# Patient Record
Sex: Female | Born: 1990 | Race: Black or African American | Hispanic: No | Marital: Single | Smoking: Never smoker
Health system: Southern US, Community
[De-identification: ages and names within clinical notes are randomized; demographics above are authoritative.]

## PROBLEM LIST (undated history)

## (undated) ENCOUNTER — Inpatient Hospital Stay (HOSPITAL_COMMUNITY): Payer: Self-pay

## (undated) DIAGNOSIS — Z789 Other specified health status: Secondary | ICD-10-CM

## (undated) DIAGNOSIS — R87629 Unspecified abnormal cytological findings in specimens from vagina: Secondary | ICD-10-CM

## (undated) DIAGNOSIS — Z3490 Encounter for supervision of normal pregnancy, unspecified, unspecified trimester: Secondary | ICD-10-CM

## (undated) HISTORY — DX: Encounter for supervision of normal pregnancy, unspecified, unspecified trimester: Z34.90

## (undated) HISTORY — DX: Other specified health status: Z78.9

## (undated) HISTORY — PX: NO PAST SURGERIES: SHX2092

## (undated) HISTORY — DX: Unspecified abnormal cytological findings in specimens from vagina: R87.629

---
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
## 2011-05-22 ENCOUNTER — Encounter: Payer: Self-pay | Admitting: *Deleted

## 2011-05-22 ENCOUNTER — Emergency Department (HOSPITAL_COMMUNITY)
Admission: EM | Admit: 2011-05-22 | Discharge: 2011-05-22 | Disposition: A | Discharge status: Home / Self Care | Payer: PRIVATE HEALTH INSURANCE | Attending: Emergency Medicine | Admitting: Emergency Medicine

## 2011-05-22 ENCOUNTER — Emergency Department (HOSPITAL_COMMUNITY): Payer: PRIVATE HEALTH INSURANCE

## 2011-05-22 DIAGNOSIS — M25579 Pain in unspecified ankle and joints of unspecified foot: Secondary | ICD-10-CM | POA: Insufficient documentation

## 2011-05-22 DIAGNOSIS — M25571 Pain in right ankle and joints of right foot: Secondary | ICD-10-CM

## 2011-05-22 MED ORDER — IBUPROFEN 800 MG PO TABS
800.0000 mg | ORAL_TABLET | Freq: Once | ORAL | Status: AC
  Administered 2011-05-22: 800 mg via ORAL
  Filled 2011-05-22: qty 1

## 2011-05-22 NOTE — ED Provider Notes (Signed)
History     CSN: [id number] Arrival date & time: 05/22/2011  2:04 PM  Chief Complaint  Patient presents with  . Ankle Pain    right    HPI  (Consider location/radiation/quality/duration/timing/severity/associated sxs/prior treatment)  Patient is a 20 y.o. female presenting with ankle pain. The history is provided by the patient. No language interpreter was used.  Ankle Pain  Incident onset: pain started 4 days ago.  no pre-existing ankle problems. There was no injury mechanism. The pain is present in the right ankle.    History reviewed. No pertinent past medical history.  History reviewed. No pertinent past surgical history.  History reviewed. No pertinent family history.  History  Substance Use Topics  . Smoking status: Never Smoker   . Smokeless tobacco: Not on file  . Alcohol Use: No    OB History    Grav Para Term Preterm Abortions TAB SAB Ect Mult Living                  Review of Systems  Review of Systems  Musculoskeletal: Positive for arthralgias. Negative for joint swelling.  All other systems reviewed and are negative.    Allergies  Review of patient's allergies indicates no known allergies.  Home Medications  No current outpatient prescriptions on file.  Physical Exam    BP 117/75  Pulse 96  Temp(Src) 98.6 F (37 C) (Oral)  Resp 20  Ht 5\' 6"  (1.676 m)  Wt 130 lb (58.968 kg)  BMI 20.98 kg/m2  SpO2 100%  LMP 05/15/2011  Physical Exam  Nursing note and vitals reviewed. Constitutional: She is oriented to person, place, and time. She appears well-developed and well-nourished. No distress.  HENT:  Head: Normocephalic and atraumatic.  Eyes: EOM are normal.  Neck: Normal range of motion.  Cardiovascular: Normal rate, regular rhythm and normal heart sounds.   Pulmonary/Chest: Effort normal and breath sounds normal.  Abdominal: Soft. She exhibits no distension. There is no tenderness.  Musculoskeletal: She exhibits no edema and no  tenderness.       Right ankle: She exhibits decreased range of motion. She exhibits no swelling, no ecchymosis, no deformity, no laceration and normal pulse. no tenderness. Achilles tendon normal.       Feet:  Neurological: She is alert and oriented to person, place, and time.  Skin: Skin is warm and dry. She is not diaphoretic.  Psychiatric: She has a normal mood and affect. Judgment normal.    ED Course  Procedures (including critical care time)  Labs Reviewed - No data to display No results found.   No diagnosis found.   MDM         Worthy Rancher, PA 05/22/11 1620

## 2011-05-22 NOTE — ED Notes (Signed)
Pt denies injury to rt ankle, No swelling noted.  Pt states pain intensifies with weight bearing.

## 2011-05-22 NOTE — ED Notes (Signed)
Pt c/o pain to right ankle when she walks; pt states the ankle contiunously pops when she walks; pt denies any injury

## 2011-05-26 ENCOUNTER — Ambulatory Visit: Payer: PRIVATE HEALTH INSURANCE | Attending: Orthopaedic Surgery | Admitting: Physical Therapy

## 2011-05-26 DIAGNOSIS — M25579 Pain in unspecified ankle and joints of unspecified foot: Secondary | ICD-10-CM | POA: Insufficient documentation

## 2011-05-26 DIAGNOSIS — R262 Difficulty in walking, not elsewhere classified: Secondary | ICD-10-CM | POA: Insufficient documentation

## 2011-05-26 DIAGNOSIS — R5381 Other malaise: Secondary | ICD-10-CM | POA: Insufficient documentation

## 2011-05-26 DIAGNOSIS — IMO0001 Reserved for inherently not codable concepts without codable children: Secondary | ICD-10-CM | POA: Insufficient documentation

## 2011-05-26 DIAGNOSIS — M25676 Stiffness of unspecified foot, not elsewhere classified: Secondary | ICD-10-CM | POA: Insufficient documentation

## 2011-05-26 DIAGNOSIS — M25673 Stiffness of unspecified ankle, not elsewhere classified: Secondary | ICD-10-CM | POA: Insufficient documentation

## 2011-05-30 ENCOUNTER — Ambulatory Visit: Payer: PRIVATE HEALTH INSURANCE | Attending: Orthopaedic Surgery | Admitting: Physical Therapy

## 2011-05-30 DIAGNOSIS — R5381 Other malaise: Secondary | ICD-10-CM | POA: Insufficient documentation

## 2011-05-30 DIAGNOSIS — M25673 Stiffness of unspecified ankle, not elsewhere classified: Secondary | ICD-10-CM | POA: Insufficient documentation

## 2011-05-30 DIAGNOSIS — R262 Difficulty in walking, not elsewhere classified: Secondary | ICD-10-CM | POA: Insufficient documentation

## 2011-05-30 DIAGNOSIS — IMO0001 Reserved for inherently not codable concepts without codable children: Secondary | ICD-10-CM | POA: Insufficient documentation

## 2011-05-30 DIAGNOSIS — M25676 Stiffness of unspecified foot, not elsewhere classified: Secondary | ICD-10-CM | POA: Insufficient documentation

## 2011-05-30 DIAGNOSIS — M25579 Pain in unspecified ankle and joints of unspecified foot: Secondary | ICD-10-CM | POA: Insufficient documentation

## 2011-05-31 NOTE — ED Provider Notes (Signed)
Medical screening examination/treatment/procedure(s) were performed by non-physician practitioner and as supervising physician I was immediately available for consultation/collaboration.   Shelda Jakes, MD 05/31/11 [phone]

## 2011-06-01 ENCOUNTER — Encounter: Payer: PRIVATE HEALTH INSURANCE | Admitting: Physical Therapy

## 2013-03-15 ENCOUNTER — Telehealth: Payer: Self-pay | Admitting: Nurse Practitioner

## 2013-03-15 NOTE — Telephone Encounter (Signed)
appt made for sat.

## 2013-03-16 ENCOUNTER — Ambulatory Visit: Payer: Self-pay

## 2013-03-16 ENCOUNTER — Emergency Department (HOSPITAL_COMMUNITY)
Admission: EM | Admit: 2013-03-16 | Discharge: 2013-03-16 | Disposition: A | Discharge status: Home / Self Care | Payer: PRIVATE HEALTH INSURANCE | Attending: Emergency Medicine | Admitting: Emergency Medicine

## 2013-03-16 ENCOUNTER — Encounter (HOSPITAL_COMMUNITY): Payer: Self-pay | Admitting: Emergency Medicine

## 2013-03-16 DIAGNOSIS — L240 Irritant contact dermatitis due to detergents: Secondary | ICD-10-CM | POA: Insufficient documentation

## 2013-03-16 DIAGNOSIS — L239 Allergic contact dermatitis, unspecified cause: Secondary | ICD-10-CM

## 2013-03-16 DIAGNOSIS — L299 Pruritus, unspecified: Secondary | ICD-10-CM | POA: Insufficient documentation

## 2013-03-16 MED ORDER — CETIRIZINE HCL 10 MG PO TABS: 10.0000 mg | ORAL_TABLET | Freq: Every day | ORAL | Status: DC

## 2013-03-16 MED ORDER — FAMOTIDINE 20 MG PO TABS
20.0000 mg | ORAL_TABLET | Freq: Once | ORAL | Status: AC
  Administered 2013-03-16: 20 mg via ORAL
  Filled 2013-03-16: qty 1

## 2013-03-16 MED ORDER — PREDNISONE 10 MG PO TABS: 20.0000 mg | ORAL_TABLET | Freq: Two times a day (BID) | ORAL | Status: DC

## 2013-03-16 MED ORDER — FAMOTIDINE 20 MG PO TABS: 20.0000 mg | ORAL_TABLET | Freq: Two times a day (BID) | ORAL | Status: DC

## 2013-03-16 MED ORDER — PREDNISONE 20 MG PO TABS
40.0000 mg | ORAL_TABLET | Freq: Once | ORAL | Status: AC
  Administered 2013-03-16: 40 mg via ORAL
  Filled 2013-03-16: qty 2

## 2013-03-16 NOTE — ED Provider Notes (Signed)
History    CSN: [id number] Arrival date & time 03/16/13  2102  First MD Initiated Contact with Patient 03/16/13 2118     Chief Complaint  Patient presents with  . Rash   (Consider location/radiation/quality/duration/timing/severity/associated sxs/prior Treatment) Patient is a 22 y.o. female presenting with rash. The history is provided by the patient.  Rash Pain location:  Generalized Associated symptoms: no chills, no fever, no nausea, no shortness of breath and no vomiting    Sandy Peters is a 22 y.o. female who presents to the ED with a rash. The rash started a few days ago. She was visiting her family in [place] and washed her clothes in a different detergent. Since wearing the clothes has had the rash and itching. Denies fever, chills, nausea or vomiting or other problems.   History reviewed. No pertinent past medical history. History reviewed. No pertinent past surgical history. Family History  Problem Relation Age of Onset  . Hypertension Mother    History  Substance Use Topics  . Smoking status: Never Smoker   . Smokeless tobacco: Not on file  . Alcohol Use: No   OB History   Grav Para Term Preterm Abortions TAB SAB Ect Mult Living                 Review of Systems  Constitutional: Negative for fever and chills.  HENT: Negative for congestion.   Eyes: Negative for itching.  Respiratory: Negative for chest tightness and shortness of breath.   Gastrointestinal: Negative for nausea and vomiting.  Skin: Positive for rash.  Psychiatric/Behavioral: The patient is not nervous/anxious.     Allergies  Review of patient's allergies indicates no known allergies.  Home Medications   Current Outpatient Rx  Name  Route  Sig  Dispense  Refill  . cetirizine (ZYRTEC) 10 MG tablet   Oral   Take 1 tablet (10 mg total) by mouth daily.   20 tablet   0   . famotidine (PEPCID) 20 MG tablet   Oral   Take 1 tablet (20 mg total) by mouth 2 (two) times daily.   30 tablet  0   . predniSONE (DELTASONE) 10 MG tablet   Oral   Take 2 tablets (20 mg total) by mouth 2 (two) times daily.   20 tablet   0    BP 121/70  Pulse 85  Temp(Src) 98.1 F (36.7 C) (Oral)  Resp 14  Ht 5\' 6"  (1.676 m)  Wt 130 lb (58.968 kg)  BMI 20.99 kg/m2  SpO2 100%  LMP 03/14/2013 Physical Exam  Nursing note and vitals reviewed. Constitutional: She is oriented to person, place, and time. She appears well-developed and well-nourished. No distress.  HENT:  Head: Normocephalic.  Mouth/Throat: Uvula is midline, oropharynx is clear and moist and mucous membranes are normal.  Eyes: EOM are normal.  Neck: Neck supple.  Cardiovascular: Normal rate.   Pulmonary/Chest: Effort normal and breath sounds normal.  Musculoskeletal: Normal range of motion.  Neurological: She is alert and oriented to person, place, and time. No cranial nerve deficit.  Skin: Rash noted.  Rash to arms, legs,back and abdomen. Small red, raised, hive like areas.  Psychiatric: She has a normal mood and affect. Her behavior is normal.    ED Course  Procedures 1. Allergic dermatitis     MDM  22 y.o. female with rash after changing laundry detergent. Will treat as allergic dermitis.  Discussed with the patient clinical findings and plan of care and all  questioned fully answered. She will follow up with dermatology if symptoms persist.   Medication List         cetirizine 10 MG tablet  Commonly known as:  ZYRTEC  Take 1 tablet (10 mg total) by mouth daily.     famotidine 20 MG tablet  Commonly known as:  PEPCID  Take 1 tablet (20 mg total) by mouth 2 (two) times daily.     predniSONE 10 MG tablet  Commonly known as:  DELTASONE  Take 2 tablets (20 mg total) by mouth 2 (two) times daily.          Janne Napoleon, [place] 03/17/13 [phone]

## 2013-03-16 NOTE — ED Notes (Signed)
Pt c/o rash that started on Wed. Small red raised areas generalized over body. Pt was visiting a relative in [place] and said her aunt used a different detergent to wash her clothes.

## 2013-03-21 NOTE — ED Provider Notes (Signed)
Medical screening examination/treatment/procedure(s) were performed by non-physician practitioner and as supervising physician I was immediately available for consultation/collaboration.  Lakayla Barrington, MD 03/21/13 2235 

## 2013-06-13 ENCOUNTER — Encounter (INDEPENDENT_AMBULATORY_CARE_PROVIDER_SITE_OTHER): Payer: Self-pay

## 2013-06-13 ENCOUNTER — Ambulatory Visit (INDEPENDENT_AMBULATORY_CARE_PROVIDER_SITE_OTHER): Payer: 59 | Admitting: Family Medicine

## 2013-06-13 ENCOUNTER — Encounter: Payer: Self-pay | Admitting: Family Medicine

## 2013-06-13 VITALS — BP 111/70 | HR 80 | Temp 97.8°F | Ht 67.5 in | Wt 140.0 lb

## 2013-06-13 DIAGNOSIS — Z309 Encounter for contraceptive management, unspecified: Secondary | ICD-10-CM

## 2013-06-13 LAB — POCT URINE PREGNANCY: Preg Test, Ur: NEGATIVE

## 2013-06-13 MED ORDER — MEDROXYPROGESTERONE ACETATE 150 MG/ML IM SUSP: 150.0000 mg | Freq: Once | INTRAMUSCULAR | Status: DC

## 2013-06-13 NOTE — Progress Notes (Signed)
  Subjective:    Patient ID: Sandy Peters, female    DOB: 1991/06/24, 22 y.o.   MRN: [id number]  HPI Patient presents today to discuss contraceptive management. Patient's is present with both her boyfriend her mother. Per patient, she has been sexually active for the past 6-7 months. Has been using condoms for protection. Has had a fairly open discussion with both her mother and her boyfriend about birth control. Patient is considering Depo-Provera for use. Last measure cycle was October 5. No history of STDs in the past.   Review of Systems  All other systems reviewed and are negative.       Objective:   Physical Exam  Constitutional: She appears well-developed and well-nourished.  HENT:  Head: Normocephalic and atraumatic.  Eyes: Conjunctivae are normal. Pupils are equal, round, and reactive to light.  Neck: Normal range of motion.  Cardiovascular: Normal rate and regular rhythm.   Pulmonary/Chest: Effort normal and breath sounds normal.  Abdominal: Soft.  Musculoskeletal: Normal range of motion.  Neurological: She is alert.  Skin: Skin is warm.          Assessment & Plan:  Contraception management - Plan: POCT urine pregnancy, medroxyPROGESTERone (DEPO-PROVERA) 150 MG/ML injection  U preg negative. Will prescribe Depo-Provera for contraception use of the next 3 months. Broached the issue of using the Mirena IUD for long-term contraception. Patient states she will discuss this with her boyfriend and make a decision on followup in 3 months. Contraception handout given. Followup as needed.

## 2013-06-13 NOTE — Patient Instructions (Signed)
Contraception Choices  Contraception (birth control) is the use of any methods or devices to prevent pregnancy. Below are some methods to help avoid pregnancy.  HORMONAL METHODS   · Contraceptive implant. This is a thin, plastic tube containing progesterone hormone. It does not contain estrogen hormone. Your caregiver inserts the tube in the inner part of the upper arm. The tube can remain in place for up to 3 years. After 3 years, the implant must be removed. The implant prevents the ovaries from releasing an egg (ovulation), thickens the cervical mucus which prevents sperm from entering the uterus, and thins the lining of the inside of the uterus.  · Progesterone-only injections. These injections are given every 3 months by your caregiver to prevent pregnancy. This synthetic progesterone hormone stops the ovaries from releasing eggs. It also thickens cervical mucus and changes the uterine lining. This makes it harder for sperm to survive in the uterus.  · Birth control pills. These pills contain estrogen and progesterone hormone. They work by stopping the egg from forming in the ovary (ovulation). Birth control pills are prescribed by a caregiver. Birth control pills can also be used to treat heavy periods.  · Minipill. This type of birth control pill contains only the progesterone hormone. They are taken every day of each month and must be prescribed by your caregiver.  · Birth control patch. The patch contains hormones similar to those in birth control pills. It must be changed once a week and is prescribed by a caregiver.  · Vaginal ring. The ring contains hormones similar to those in birth control pills. It is left in the vagina for 3 weeks, removed for 1 week, and then a new one is put back in place. The patient must be comfortable inserting and removing the ring from the vagina. A caregiver's prescription is necessary.  · Emergency contraception. Emergency contraceptives prevent pregnancy after unprotected  sexual intercourse. This pill can be taken right after sex or up to 5 days after unprotected sex. It is most effective the sooner you take the pills after having sexual intercourse. Emergency contraceptive pills are available without a prescription. Check with your pharmacist. Do not use emergency contraception as your only form of birth control.  BARRIER METHODS   · Female condom. This is a thin sheath (latex or rubber) that is worn over the penis during sexual intercourse. It can be used with spermicide to increase effectiveness.  · Female condom. This is a soft, loose-fitting sheath that is put into the vagina before sexual intercourse.  · Diaphragm. This is a soft, latex, dome-shaped barrier that must be fitted by a caregiver. It is inserted into the vagina, along with a spermicidal jelly. It is inserted before intercourse. The diaphragm should be left in the vagina for 6 to 8 hours after intercourse.  · Cervical cap. This is a round, soft, latex or plastic cup that fits over the cervix and must be fitted by a caregiver. The cap can be left in place for up to 48 hours after intercourse.  · Sponge. This is a soft, circular piece of polyurethane foam. The sponge has spermicide in it. It is inserted into the vagina after wetting it and before sexual intercourse.  · Spermicides. These are chemicals that kill or block sperm from entering the cervix and uterus. They come in the form of creams, jellies, suppositories, foam, or tablets. They do not require a prescription. They are inserted into the vagina with an applicator before having sexual intercourse.   The process must be repeated every time you have sexual intercourse.  INTRAUTERINE CONTRACEPTION  · Intrauterine device (IUD). This is a T-shaped device that is put in a woman's uterus during a menstrual period to prevent pregnancy. There are 2 types:  · Copper IUD. This type of IUD is wrapped in copper wire and is placed inside the uterus. Copper makes the uterus and  fallopian tubes produce a fluid that kills sperm. It can stay in place for 10 years.  · Hormone IUD. This type of IUD contains the hormone progestin (synthetic progesterone). The hormone thickens the cervical mucus and prevents sperm from entering the uterus, and it also thins the uterine lining to prevent implantation of a fertilized egg. The hormone can weaken or kill the sperm that get into the uterus. It can stay in place for 5 years.  PERMANENT METHODS OF CONTRACEPTION  · Female tubal ligation. This is when the woman's fallopian tubes are surgically sealed, tied, or blocked to prevent the egg from traveling to the uterus.  · Female sterilization. This is when the female has the tubes that carry sperm tied off (vasectomy). This blocks sperm from entering the vagina during sexual intercourse. After the procedure, the man can still ejaculate fluid (semen).  NATURAL PLANNING METHODS  · Natural family planning. This is not having sexual intercourse or using a barrier method (condom, diaphragm, cervical cap) on days the woman could become pregnant.  · Calendar method. This is keeping track of the length of each menstrual cycle and identifying when you are fertile.  · Ovulation method. This is avoiding sexual intercourse during ovulation.  · Symptothermal method. This is avoiding sexual intercourse during ovulation, using a thermometer and ovulation symptoms.  · Post-ovulation method. This is timing sexual intercourse after you have ovulated.  Regardless of which type or method of contraception you choose, it is important that you use condoms to protect against the transmission of sexually transmitted diseases (STDs). Talk with your caregiver about which form of contraception is most appropriate for you.  Document Released: 08/15/2005 Document Revised: 11/07/2011 Document Reviewed: 12/22/2010  [employer]® Patient Information ©2014 [employer].

## 2013-06-14 ENCOUNTER — Ambulatory Visit: Payer: Self-pay | Admitting: General Practice

## 2013-06-18 ENCOUNTER — Ambulatory Visit (INDEPENDENT_AMBULATORY_CARE_PROVIDER_SITE_OTHER): Payer: 59 | Admitting: *Deleted

## 2013-06-18 DIAGNOSIS — IMO0001 Reserved for inherently not codable concepts without codable children: Secondary | ICD-10-CM

## 2013-06-18 DIAGNOSIS — Z309 Encounter for contraceptive management, unspecified: Secondary | ICD-10-CM

## 2013-06-18 MED ORDER — MEDROXYPROGESTERONE ACETATE 150 MG/ML IM SUSP
150.0000 mg | Freq: Once | INTRAMUSCULAR | Status: AC
  Administered 2013-06-18: 150 mg via INTRAMUSCULAR

## 2013-09-13 ENCOUNTER — Other Ambulatory Visit: Payer: Self-pay | Admitting: *Deleted

## 2013-09-13 ENCOUNTER — Ambulatory Visit (INDEPENDENT_AMBULATORY_CARE_PROVIDER_SITE_OTHER): Payer: 59 | Admitting: *Deleted

## 2013-09-13 DIAGNOSIS — Z309 Encounter for contraceptive management, unspecified: Secondary | ICD-10-CM

## 2013-09-13 DIAGNOSIS — IMO0001 Reserved for inherently not codable concepts without codable children: Secondary | ICD-10-CM

## 2013-09-13 MED ORDER — MEDROXYPROGESTERONE ACETATE 150 MG/ML IM SUSP
150.0000 mg | Freq: Once | INTRAMUSCULAR | Status: AC
  Administered 2013-09-13: 150 mg via INTRAMUSCULAR

## 2013-09-13 NOTE — Progress Notes (Signed)
Depo provera given and tolerated well. 

## 2013-09-13 NOTE — Patient Instructions (Signed)

## 2013-09-17 MED ORDER — MEDROXYPROGESTERONE ACETATE 150 MG/ML IM SUSP: 150.0000 mg | Freq: Once | INTRAMUSCULAR | Status: DC

## 2013-12-02 ENCOUNTER — Telehealth: Payer: Self-pay | Admitting: Family Medicine

## 2013-12-02 NOTE — Telephone Encounter (Signed)
Left patient a message April the 17th being the last day she could get it

## 2013-12-13 ENCOUNTER — Ambulatory Visit: Payer: 59

## 2013-12-27 ENCOUNTER — Other Ambulatory Visit: Payer: 59

## 2014-03-14 ENCOUNTER — Telehealth: Payer: Self-pay | Admitting: Nurse Practitioner

## 2014-03-14 ENCOUNTER — Ambulatory Visit: Payer: 59 | Admitting: General Practice

## 2014-03-14 NOTE — Telephone Encounter (Signed)
appt moved til wed

## 2014-03-19 ENCOUNTER — Encounter (INDEPENDENT_AMBULATORY_CARE_PROVIDER_SITE_OTHER): Payer: Self-pay

## 2014-03-19 ENCOUNTER — Encounter: Payer: Self-pay | Admitting: Nurse Practitioner

## 2014-03-19 ENCOUNTER — Ambulatory Visit (INDEPENDENT_AMBULATORY_CARE_PROVIDER_SITE_OTHER): Payer: 59 | Admitting: Nurse Practitioner

## 2014-03-19 VITALS — BP 108/62 | HR 98 | Temp 98.2°F | Ht 67.0 in | Wt 157.0 lb

## 2014-03-19 DIAGNOSIS — N926 Irregular menstruation, unspecified: Secondary | ICD-10-CM

## 2014-03-19 LAB — POCT URINE PREGNANCY: Preg Test, Ur: NEGATIVE

## 2014-03-19 NOTE — Patient Instructions (Signed)

## 2014-03-19 NOTE — Progress Notes (Signed)
   Subjective:    Patient ID: Sandy Peters, female    DOB: 12/06/1990, 23 y.o.   MRN: [id number]  HPI Patient in c/o no menses for over 2 months-usually has period every 28 days. Not on birth control. Use to be on birth control ( Depo ) but stopped taking in January due to expensive. She has insurance now that will pay for it now.    Review of Systems  Constitutional: Negative.   HENT: Negative.   Respiratory: Negative.   Cardiovascular: Negative.   Gastrointestinal: Negative.   Genitourinary: Negative.   Psychiatric/Behavioral: Negative.   All other systems reviewed and are negative.      Objective:   Physical Exam  Constitutional: She is oriented to person, place, and time. She appears well-developed and well-nourished.  Cardiovascular: Normal rate, regular rhythm and normal heart sounds.   Pulmonary/Chest: Effort normal and breath sounds normal.  Abdominal: Soft. Bowel sounds are normal.  Neurological: She is alert and oriented to person, place, and time.  Skin: Skin is warm and dry.  Psychiatric: She has a normal mood and affect. Her behavior is normal. Judgment and thought content normal.    BP 108/62  Pulse 98  Temp(Src) 98.2 F (36.8 C) (Oral)  Ht 5\' 7"  (1.702 m)  Wt 157 lb (71.215 kg)  BMI 24.58 kg/m2  Results for orders placed in visit on 06/13/13  POCT URINE PREGNANCY      Result Value Ref Range   Preg Test, Ur Negative           Assessment & Plan:   1. Missed period   2. Irregular menstrual cycle    Orders Placed This Encounter  Procedures  . hCG, quantitative, pregnancy  . POCT urine pregnancy   depoprovera once neg blood preg Patient to return to the office next week for her first PAP Will discuss safe sex at next appointment  Mary-Margaret Daphine DeutscherMartin, FNP

## 2014-03-20 ENCOUNTER — Telehealth: Payer: Self-pay | Admitting: Family Medicine

## 2014-03-20 ENCOUNTER — Other Ambulatory Visit: Payer: Self-pay | Admitting: Nurse Practitioner

## 2014-03-20 LAB — HCG, QUANTITATIVE, PREGNANCY: hCG Quant: <1 m[IU]/mL

## 2014-03-20 MED ORDER — MEDROXYPROGESTERONE ACETATE 150 MG/ML IM SUSP: 150.0000 mg | INTRAMUSCULAR | Status: DC

## 2014-03-20 NOTE — Telephone Encounter (Signed)
Message copied by Azalee CourseFULP, ASHLEY on Thu Mar 20, 2014  2:33 PM ------      Message from: Bennie PieriniMARTIN, MARY-MARGARET      Created: Thu Mar 20, 2014  1:06 PM       Negative- will send in depo-provera rx ------

## 2014-03-22 LAB — SPECIMEN STATUS REPORT

## 2014-03-26 ENCOUNTER — Ambulatory Visit: Payer: Self-pay | Admitting: Nurse Practitioner

## 2015-02-26 ENCOUNTER — Encounter: Payer: 59 | Admitting: Advanced Practice Midwife

## 2015-02-28 LAB — OB RESULTS CONSOLE GBS: STREP GROUP B AG: POSITIVE

## 2015-03-03 ENCOUNTER — Ambulatory Visit (INDEPENDENT_AMBULATORY_CARE_PROVIDER_SITE_OTHER): Payer: Medicaid Other | Admitting: Adult Health

## 2015-03-03 ENCOUNTER — Encounter: Payer: Self-pay | Admitting: Adult Health

## 2015-03-03 VITALS — BP 100/62 | HR 92 | Ht 66.0 in | Wt 148.0 lb

## 2015-03-03 DIAGNOSIS — Z3490 Encounter for supervision of normal pregnancy, unspecified, unspecified trimester: Secondary | ICD-10-CM

## 2015-03-03 DIAGNOSIS — Z3201 Encounter for pregnancy test, result positive: Secondary | ICD-10-CM | POA: Diagnosis not present

## 2015-03-03 DIAGNOSIS — Z3400 Encounter for supervision of normal first pregnancy, unspecified trimester: Secondary | ICD-10-CM | POA: Insufficient documentation

## 2015-03-03 DIAGNOSIS — O3680X0 Pregnancy with inconclusive fetal viability, not applicable or unspecified: Secondary | ICD-10-CM

## 2015-03-03 DIAGNOSIS — N926 Irregular menstruation, unspecified: Secondary | ICD-10-CM | POA: Diagnosis not present

## 2015-03-03 HISTORY — DX: Encounter for supervision of normal pregnancy, unspecified, unspecified trimester: Z34.90

## 2015-03-03 LAB — POCT URINE PREGNANCY: Preg Test, Ur: POSITIVE — AB

## 2015-03-03 MED ORDER — PRENATAL PLUS 27-1 MG PO TABS
1.0000 | ORAL_TABLET | Freq: Every day | ORAL | Status: DC
Start: 2015-03-03 — End: 2015-11-26

## 2015-03-03 NOTE — Patient Instructions (Signed)
First Trimester of Pregnancy The first trimester of pregnancy is from week 1 until the end of week 12 (months 1 through 3). A week after a sperm fertilizes an egg, the egg will implant on the wall of the uterus. This embryo will begin to develop into a baby. Genes from you and your partner are forming the baby. The female genes determine whether the baby is a boy or a girl. At 6-8 weeks, the eyes and face are formed, and the heartbeat can be seen on ultrasound. At the end of 12 weeks, all the baby's organs are formed.  Now that you are pregnant, you will want to do everything you can to have a healthy baby. Two of the most important things are to get good prenatal care and to follow your health care provider's instructions. Prenatal care is all the medical care you receive before the baby's birth. This care will help prevent, find, and treat any problems during the pregnancy and childbirth. BODY CHANGES Your body goes through many changes during pregnancy. The changes vary from woman to woman.   You may gain or lose a couple of pounds at first.  You may feel sick to your stomach (nauseous) and throw up (vomit). If the vomiting is uncontrollable, call your health care provider.  You may tire easily.  You may develop headaches that can be relieved by medicines approved by your health care provider.  You may urinate more often. Painful urination may mean you have a bladder infection.  You may develop heartburn as a result of your pregnancy.  You may develop constipation because certain hormones are causing the muscles that push waste through your intestines to slow down.  You may develop hemorrhoids or swollen, bulging veins (varicose veins).  Your breasts may begin to grow larger and become tender. Your nipples may stick out more, and the tissue that surrounds them (areola) may become darker.  Your gums may bleed and may be sensitive to brushing and flossing.  Dark spots or blotches (chloasma,  mask of pregnancy) may develop on your face. This will likely fade after the baby is born.  Your menstrual periods will stop.  You may have a loss of appetite.  You may develop cravings for certain kinds of food.  You may have changes in your emotions from day to day, such as being excited to be pregnant or being concerned that something may go wrong with the pregnancy and baby.  You may have more vivid and strange dreams.  You may have changes in your hair. These can include thickening of your hair, rapid growth, and changes in texture. Some women also have hair loss during or after pregnancy, or hair that feels dry or thin. Your hair will most likely return to normal after your baby is born. WHAT TO EXPECT AT YOUR PRENATAL VISITS During a routine prenatal visit:  You will be weighed to make sure you and the baby are growing normally.  Your blood pressure will be taken.  Your abdomen will be measured to track your baby's growth.  The fetal heartbeat will be listened to starting around week 10 or 12 of your pregnancy.  Test results from any previous visits will be discussed. Your health care provider may ask you:  How you are feeling.  If you are feeling the baby move.  If you have had any abnormal symptoms, such as leaking fluid, bleeding, severe headaches, or abdominal cramping.  If you have any questions. Other tests   that may be performed during your first trimester include:  Blood tests to find your blood type and to check for the presence of any previous infections. They will also be used to check for low iron levels (anemia) and Rh antibodies. Later in the pregnancy, blood tests for diabetes will be done along with other tests if problems develop.  Urine tests to check for infections, diabetes, or protein in the urine.  An ultrasound to confirm the proper growth and development of the baby.  An amniocentesis to check for possible genetic problems.  Fetal screens for  spina bifida and Down syndrome.  You may need other tests to make sure you and the baby are doing well. HOME CARE INSTRUCTIONS  Medicines  Follow your health care provider's instructions regarding medicine use. Specific medicines may be either safe or unsafe to take during pregnancy.  Take your prenatal vitamins as directed.  If you develop constipation, try taking a stool softener if your health care provider approves. Diet  Eat regular, well-balanced meals. Choose a variety of foods, such as meat or vegetable-based protein, fish, milk and low-fat dairy products, vegetables, fruits, and whole grain breads and cereals. Your health care provider will help you determine the amount of weight gain that is right for you.  Avoid raw meat and uncooked cheese. These carry germs that can cause birth defects in the baby.  Eating four or five small meals rather than three large meals a day may help relieve nausea and vomiting. If you start to feel nauseous, eating a few soda crackers can be helpful. Drinking liquids between meals instead of during meals also seems to help nausea and vomiting.  If you develop constipation, eat more high-fiber foods, such as fresh vegetables or fruit and whole grains. Drink enough fluids to keep your urine clear or pale yellow. Activity and Exercise  Exercise only as directed by your health care provider. Exercising will help you:  Control your weight.  Stay in shape.  Be prepared for labor and delivery.  Experiencing pain or cramping in the lower abdomen or low back is a good sign that you should stop exercising. Check with your health care provider before continuing normal exercises.  Try to avoid standing for long periods of time. Move your legs often if you must stand in one place for a long time.  Avoid heavy lifting.  Wear low-heeled shoes, and practice good posture.  You may continue to have sex unless your health care provider directs you  otherwise. Relief of Pain or Discomfort  Wear a good support bra for breast tenderness.   Take warm sitz baths to soothe any pain or discomfort caused by hemorrhoids. Use hemorrhoid cream if your health care provider approves.   Rest with your legs elevated if you have leg cramps or low back pain.  If you develop varicose veins in your legs, wear support hose. Elevate your feet for 15 minutes, 3-4 times a day. Limit salt in your diet. Prenatal Care  Schedule your prenatal visits by the twelfth week of pregnancy. They are usually scheduled monthly at first, then more often in the last 2 months before delivery.  Write down your questions. Take them to your prenatal visits.  Keep all your prenatal visits as directed by your health care provider. Safety  Wear your seat belt at all times when driving.  Make a list of emergency phone numbers, including numbers for family, friends, the hospital, and police and fire departments. General Tips    Ask your health care provider for a referral to a local prenatal education class. Begin classes no later than at the beginning of month 6 of your pregnancy.  Ask for help if you have counseling or nutritional needs during pregnancy. Your health care provider can offer advice or refer you to specialists for help with various needs.  Do not use hot tubs, steam rooms, or saunas.  Do not douche or use tampons or scented sanitary pads.  Do not cross your legs for long periods of time.  Avoid cat litter boxes and soil used by cats. These carry germs that can cause birth defects in the baby and possibly loss of the fetus by miscarriage or stillbirth.  Avoid all smoking, herbs, alcohol, and medicines not prescribed by your health care provider. Chemicals in these affect the formation and growth of the baby.  Schedule a dentist appointment. At home, brush your teeth with a soft toothbrush and be gentle when you floss. SEEK MEDICAL CARE IF:   You have  dizziness.  You have mild pelvic cramps, pelvic pressure, or nagging pain in the abdominal area.  You have persistent nausea, vomiting, or diarrhea.  You have a bad smelling vaginal discharge.  You have pain with urination.  You notice increased swelling in your face, hands, legs, or ankles. SEEK IMMEDIATE MEDICAL CARE IF:   You have a fever.  You are leaking fluid from your vagina.  You have spotting or bleeding from your vagina.  You have severe abdominal cramping or pain.  You have rapid weight gain or loss.  You vomit blood or material that looks like coffee grounds.  You are exposed to German measles and have never had them.  You are exposed to fifth disease or chickenpox.  You develop a severe headache.  You have shortness of breath.  You have any kind of trauma, such as from a fall or a car accident. Document Released: 08/09/2001 Document Revised: 12/30/2013 Document Reviewed: 06/25/2013 [employer] Patient Information 2015 [employer]. This information is not intended to replace advice given to you by your health care provider. Make sure you discuss any questions you have with your health care provider. Return in 1 week for dating US 

## 2015-03-03 NOTE — Progress Notes (Signed)
Subjective:     Patient ID: Sandy Peters, female   DOB: 10/21/1990, 24 y.o.   MRN: [id number]  HPI Sandy Peters is a 24 year old black female in for having missed a period and wants UPT,has had breast tenderness, no pain or bleeding or nausea or vomiting, her partner has the nausea.  Review of Systems Patient denies any headaches, hearing loss, fatigue, blurred vision, shortness of breath, chest pain, abdominal pain, problems with bowel movements, urination, or intercourse. No joint pain or mood swings.See HPI for positives.  Reviewed past medical,surgical, social and family history. Reviewed medications and allergies.      Objective:   Physical Exam BP 100/62 mmHg  Pulse 92  Ht 5\' 6"  (1.676 m)  Wt 148 lb (67.132 kg)  BMI 23.90 kg/m2  LMP 01/11/2015 UPT +, about 7+2 weeks with EDD 10/18/15 by LMP, medicaid form given, Skin warm and dry. Neck: mid line trachea, normal thyroid, good ROM, no lymphadenopathy noted. Lungs: clear to ausculation bilaterally. Cardiovascular: regular rate and rhythm.Abdomen soft non tneder    Assessment:     Pregnant     Plan:     Rx prenatal plus #30 take 1 daily with 11 refills Return in 1 week for dating US Review handout on first trimester

## 2015-03-10 ENCOUNTER — Other Ambulatory Visit: Payer: Self-pay

## 2015-03-10 ENCOUNTER — Ambulatory Visit: Payer: 59

## 2015-03-11 ENCOUNTER — Ambulatory Visit (INDEPENDENT_AMBULATORY_CARE_PROVIDER_SITE_OTHER): Payer: Medicaid Other

## 2015-03-11 DIAGNOSIS — O3680X0 Pregnancy with inconclusive fetal viability, not applicable or unspecified: Secondary | ICD-10-CM | POA: Diagnosis not present

## 2015-03-11 NOTE — Progress Notes (Signed)
[place] 9+1wks single IUP ,pos fht 176bpm,crl 23.188mm,normal ov's bilat

## 2015-03-23 ENCOUNTER — Encounter: Payer: Self-pay | Admitting: Women's Health

## 2015-03-23 ENCOUNTER — Other Ambulatory Visit (HOSPITAL_COMMUNITY)
Admission: RE | Admit: 2015-03-23 | Discharge: 2015-03-23 | Disposition: A | Discharge status: Home / Self Care | Payer: Medicaid Other | Source: Ambulatory Visit | Attending: Obstetrics & Gynecology | Admitting: Obstetrics & Gynecology

## 2015-03-23 ENCOUNTER — Ambulatory Visit (INDEPENDENT_AMBULATORY_CARE_PROVIDER_SITE_OTHER): Payer: Medicaid Other | Admitting: Women's Health

## 2015-03-23 VITALS — BP 102/64 | HR 76 | Ht 65.0 in | Wt 151.0 lb

## 2015-03-23 DIAGNOSIS — Z113 Encounter for screening for infections with a predominantly sexual mode of transmission: Secondary | ICD-10-CM | POA: Insufficient documentation

## 2015-03-23 DIAGNOSIS — Z0283 Encounter for blood-alcohol and blood-drug test: Secondary | ICD-10-CM

## 2015-03-23 DIAGNOSIS — Z331 Pregnant state, incidental: Secondary | ICD-10-CM

## 2015-03-23 DIAGNOSIS — Z124 Encounter for screening for malignant neoplasm of cervix: Secondary | ICD-10-CM

## 2015-03-23 DIAGNOSIS — Z01411 Encounter for gynecological examination (general) (routine) with abnormal findings: Secondary | ICD-10-CM | POA: Diagnosis not present

## 2015-03-23 DIAGNOSIS — Z3401 Encounter for supervision of normal first pregnancy, first trimester: Secondary | ICD-10-CM

## 2015-03-23 DIAGNOSIS — Z3682 Encounter for antenatal screening for nuchal translucency: Secondary | ICD-10-CM

## 2015-03-23 DIAGNOSIS — Z369 Encounter for antenatal screening, unspecified: Secondary | ICD-10-CM

## 2015-03-23 DIAGNOSIS — Z1389 Encounter for screening for other disorder: Secondary | ICD-10-CM

## 2015-03-23 LAB — POCT URINALYSIS DIPSTICK
Glucose, UA: NEGATIVE
Ketones, UA: NEGATIVE
Leukocytes, UA: NEGATIVE
Nitrite, UA: NEGATIVE
PROTEIN UA: NEGATIVE
RBC UA: NEGATIVE

## 2015-03-23 NOTE — Progress Notes (Signed)
  Subjective:  Sandy Peters is a 24 y.o. G1P0 African American female at [redacted]w[redacted]d by LMP c/w 9wk u/s, being seen today for her first obstetrical visit.  Her obstetrical history is significant for primigravida.  Pregnancy history fully reviewed.  Patient reports no complaints. Denies vb, cramping, uti s/s, abnormal/malodorous vag d/c, or vulvovaginal itching/irritation.  BP 102/64 mmHg  Pulse 76  Wt 151 lb (68.493 kg)  LMP 01/11/2015 (Exact Date)  HISTORY: OB History  Gravida Para Term Preterm AB SAB TAB Ectopic Multiple Living  1             # Outcome Date GA Lbr Len/2nd Weight Sex Delivery Anes PTL Lv  1 Current              Past Medical History  Diagnosis Date  . Pregnant 03/03/2015  . Medical history non-contributory    Past Surgical History  Procedure Laterality Date  . No past surgeries     Family History  Problem Relation Age of Onset  . Hypertension Mother   . Anemia Maternal Grandmother   . Hypertension Maternal Grandmother   . Thyroid disease Maternal Grandmother   . Cancer Maternal Grandfather     lung, brain    Exam   System:     General: Well developed & nourished, no acute distress   Skin: Warm & dry, normal coloration and turgor, no rashes   Neurologic: Alert & oriented, normal mood   Cardiovascular: Regular rate & rhythm   Respiratory: Effort & rate normal, LCTAB, acyanotic   Abdomen: Soft, non tender   Extremities: normal strength, tone   Pelvic Exam:    Perineum: Normal perineum   Vulva: Normal, no lesions   Vagina:  Normal mucosa, normal discharge   Cervix: Normal, bulbous, appears closed   Uterus: Normal size/shape/contour for GA   Thin prep pap smear obtained w/ reflex high risk HPV cotesting FHR: 175 via doppler   Assessment:   Pregnancy: G1P0 Patient Active Problem List   Diagnosis Date Noted  . Supervision of normal first pregnancy 03/03/2015    Priority: High    [redacted]w[redacted]d G1P0 New OB visit  Plan:  Initial labs drawn Continue  prenatal vitamins Problem list reviewed and updated Reviewed n/v relief measures and warning s/s to report Reviewed recommended weight gain based on pre-gravid BMI Encouraged well-balanced diet Genetic Screening discussed Integrated Screen: requested Cystic fibrosis screening discussed requested Ultrasound discussed; fetal survey: requested Follow up in 2 weeks for 1st it/nt and visit [facility] completed NFPartnership offered, accepted, referral faxed   Marge Duncans CNM, [facility] 03/23/2015 9:16 AM

## 2015-03-23 NOTE — Patient Instructions (Signed)

## 2015-03-24 LAB — CYTOLOGY - PAP

## 2015-03-25 ENCOUNTER — Encounter: Payer: Self-pay | Admitting: Women's Health

## 2015-03-25 ENCOUNTER — Telehealth: Payer: Self-pay | Admitting: Women's Health

## 2015-03-25 DIAGNOSIS — R8271 Bacteriuria: Secondary | ICD-10-CM

## 2015-03-25 LAB — URINE CULTURE

## 2015-03-25 MED ORDER — AMOXICILLIN 500 MG PO CAPS: 500.0000 mg | ORAL_CAPSULE | Freq: Two times a day (BID) | ORAL | Status: DC

## 2015-03-25 NOTE — Telephone Encounter (Signed)
Notified pt of GBS bacteruria, rx for amoxicillin  bid x 7d sent to her pharmacy. Will treat during labor.  Cheral Marker, CNM, [facility] 03/25/2015 10:38 AM

## 2015-03-31 LAB — HEPATITIS B SURFACE ANTIGEN: Hepatitis B Surface Ag: NEGATIVE

## 2015-03-31 LAB — CYSTIC FIBROSIS MUTATION 97: GENE DIS ANAL CARRIER INTERP BLD/T-IMP: NOT DETECTED

## 2015-03-31 LAB — URINALYSIS, ROUTINE W REFLEX MICROSCOPIC
BILIRUBIN UA: NEGATIVE
Glucose, UA: NEGATIVE
Ketones, UA: NEGATIVE
Leukocytes, UA: NEGATIVE
NITRITE UA: NEGATIVE
Protein, UA: NEGATIVE
RBC, UA: NEGATIVE
Specific Gravity, UA: 1.022 (ref 1.005–1.030)
UUROB: 0.2 mg/dL (ref 0.2–1.0)
pH, UA: 7 (ref 5.0–7.5)

## 2015-03-31 LAB — RPR: RPR: NONREACTIVE

## 2015-03-31 LAB — PMP SCREEN PROFILE (10S), URINE
AMPHETAMINE SCRN UR: NEGATIVE ng/mL
Barbiturate Screen, Ur: NEGATIVE ng/mL
Benzodiazepine Screen, Urine: NEGATIVE ng/mL
CANNABINOIDS UR QL SCN: NEGATIVE ng/mL
COCAINE(METAB.) SCREEN, URINE: NEGATIVE ng/mL
CREATININE(CRT), U: 94.4 mg/dL (ref 20.0–300.0)
Methadone Scn, Ur: NEGATIVE ng/mL
OPIATE SCRN UR: NEGATIVE ng/mL
OXYCODONE+OXYMORPHONE UR QL SCN: NEGATIVE ng/mL
PCP Scrn, Ur: NEGATIVE ng/mL
Ph of Urine: 6.6 (ref 4.5–8.9)
Propoxyphene, Screen: NEGATIVE ng/mL

## 2015-03-31 LAB — RUBELLA SCREEN: RUBELLA: 3.98 {index} (ref >0.99)

## 2015-03-31 LAB — CBC
Hematocrit: 33.9 % — ABNORMAL LOW (ref 34.0–46.6)
Hemoglobin: 11.4 g/dL (ref 11.1–15.9)
MCH: 30.2 pg (ref 26.6–33.0)
MCHC: 33.6 g/dL (ref 31.5–35.7)
MCV: 90 fL (ref 79–97)
Platelets: 244 10*3/uL (ref 150–379)
RBC: 3.77 x10E6/uL (ref 3.77–5.28)
RDW: 14.8 % (ref 12.3–15.4)
WBC: 4.4 10*3/uL (ref 3.4–10.8)

## 2015-03-31 LAB — SICKLE CELL SCREEN: Sickle Cell Screen: NEGATIVE

## 2015-03-31 LAB — VARICELLA ZOSTER ANTIBODY, IGG: Varicella zoster IgG: 310 index (ref >165)

## 2015-03-31 LAB — HIV ANTIBODY (ROUTINE TESTING W REFLEX): HIV Screen 4th Generation wRfx: NONREACTIVE

## 2015-03-31 LAB — ABO/RH: Rh Factor: POSITIVE

## 2015-03-31 LAB — ANTIBODY SCREEN: Antibody Screen: NEGATIVE

## 2015-04-06 ENCOUNTER — Other Ambulatory Visit: Payer: Self-pay

## 2015-04-07 ENCOUNTER — Ambulatory Visit (INDEPENDENT_AMBULATORY_CARE_PROVIDER_SITE_OTHER): Payer: Self-pay | Admitting: Women's Health

## 2015-04-07 ENCOUNTER — Encounter: Payer: Self-pay | Admitting: Women's Health

## 2015-04-07 ENCOUNTER — Ambulatory Visit (INDEPENDENT_AMBULATORY_CARE_PROVIDER_SITE_OTHER): Payer: Medicaid Other

## 2015-04-07 VITALS — BP 102/60 | HR 80 | Ht 65.0 in | Wt 149.0 lb

## 2015-04-07 DIAGNOSIS — Z3401 Encounter for supervision of normal first pregnancy, first trimester: Secondary | ICD-10-CM

## 2015-04-07 DIAGNOSIS — Z331 Pregnant state, incidental: Secondary | ICD-10-CM

## 2015-04-07 DIAGNOSIS — Z3682 Encounter for antenatal screening for nuchal translucency: Secondary | ICD-10-CM

## 2015-04-07 DIAGNOSIS — Z36 Encounter for antenatal screening of mother: Secondary | ICD-10-CM | POA: Diagnosis not present

## 2015-04-07 DIAGNOSIS — R8271 Bacteriuria: Secondary | ICD-10-CM

## 2015-04-07 DIAGNOSIS — Z1389 Encounter for screening for other disorder: Secondary | ICD-10-CM

## 2015-04-07 LAB — POCT URINALYSIS DIPSTICK
Glucose, UA: NEGATIVE
KETONES UA: NEGATIVE
Leukocytes, UA: NEGATIVE
Nitrite, UA: NEGATIVE
Protein, UA: NEGATIVE
RBC UA: NEGATIVE

## 2015-04-07 NOTE — Progress Notes (Signed)
[place] 12+2wks single IUP,ant pl,nb present,NT 1.23mm,crl 61.18mm,normal ov's bilat

## 2015-04-07 NOTE — Progress Notes (Signed)
Low-risk OB appointment G1P0 [redacted]w[redacted]d Estimated Date of Delivery: 10/18/15 BP 102/60 mmHg  Pulse 80  Ht  (1.549 m)  Wt 149 lb (67.586 kg)  BMI 28.17 kg/m2  LMP 01/11/2015 (Exact Date)  BP, weight, and urine reviewed.  Refer to obstetrical flow sheet for FH & FHR.  No fm yet. Denies cramping, lof, vb, or uti s/s. No complaints. Completed meds for gbs bacteruria, will resend cx today.  Reviewed today's normal nt u/s, warning s/s to report. Plan:  Continue routine obstetrical care  F/U in 4wks for OB appointment and 2nd IT

## 2015-04-08 ENCOUNTER — Encounter: Payer: Self-pay | Admitting: Advanced Practice Midwife

## 2015-04-08 LAB — URINE CULTURE

## 2015-04-09 ENCOUNTER — Other Ambulatory Visit: Payer: Self-pay

## 2015-04-09 LAB — MATERNAL SCREEN, INTEGRATED #1
Crown Rump Length: 61.1 mm
GEST. AGE ON COLLECTION DATE: 12.6 wk
MATERNAL AGE AT EDD: 24 a
NUMBER OF FETUSES: 1
Nuchal Translucency (NT): 1.1 mm
PAPP-A Value: 739.5 ng/mL
WEIGHT: 149 [lb_av]

## 2015-04-17 ENCOUNTER — Telehealth: Payer: Self-pay | Admitting: *Deleted

## 2015-04-17 NOTE — Telephone Encounter (Signed)
Spoke with pt. Pt was seen in the ER last night with bleeding and dehydration. She was given fluids. Pt was diagnosed with BV and was given Flagyl. I advised to hold on taking Flagyl until after she sees provider Tuesday. Bleeding has stopped.  Pt works at [employer] and wonders if it will be ok to work this weekend. If not, she will need a note. Thanks!! JSY

## 2015-04-17 NOTE — Telephone Encounter (Signed)
Was seen at ER at [facility] for bleeding, she got IV fluids and rx for flagyl, cervix was closed and baby had good heart rate, she wants to know if ok to take flagyl and it is, no sex or heavy lifting, keep appt Tuesday

## 2015-04-21 ENCOUNTER — Ambulatory Visit (INDEPENDENT_AMBULATORY_CARE_PROVIDER_SITE_OTHER): Payer: Medicaid Other | Admitting: Adult Health

## 2015-04-21 ENCOUNTER — Encounter: Payer: Self-pay | Admitting: Adult Health

## 2015-04-21 VITALS — BP 110/70 | HR 84 | Wt 150.0 lb

## 2015-04-21 DIAGNOSIS — Z1389 Encounter for screening for other disorder: Secondary | ICD-10-CM

## 2015-04-21 DIAGNOSIS — Z3402 Encounter for supervision of normal first pregnancy, second trimester: Secondary | ICD-10-CM

## 2015-04-21 DIAGNOSIS — Z331 Pregnant state, incidental: Secondary | ICD-10-CM

## 2015-04-21 LAB — POCT URINALYSIS DIPSTICK
Blood, UA: NEGATIVE
GLUCOSE UA: NEGATIVE
Ketones, UA: NEGATIVE
LEUKOCYTES UA: NEGATIVE
NITRITE UA: NEGATIVE
Protein, UA: NEGATIVE

## 2015-04-21 NOTE — Progress Notes (Signed)
G1P0 [redacted]w[redacted]d Estimated Date of Delivery: 10/18/15  Blood pressure 110/70, pulse 84, weight 150 lb (68.04 kg), last menstrual period 01/11/2015.   BP weight and urine results all reviewed and noted.  Please refer to the obstetrical flow sheet for the fundal height and fetal heart rate documentation:FHR 160 via doppler  Patient  denies any bleeding and no rupture of membranes symptoms or regular contractions.had bleeding at work, seen at ER at [facility] and was treated for BV with flagyl  and has had no bleeding since Saturday.  Patient is without complaints. All questions were answered.  Plan:  Continued routine obstetrical care,   Follow up  As sccheduled for OB appointment, 05/05/15

## 2015-04-21 NOTE — Patient Instructions (Signed)
Bacterial Vaginosis Bacterial vaginosis is a vaginal infection that occurs when the normal balance of bacteria in the vagina is disrupted. It results from an overgrowth of certain bacteria. This is the most common vaginal infection in women of childbearing age. Treatment is important to prevent complications, especially in pregnant women, as it can cause a premature delivery. CAUSES  Bacterial vaginosis is caused by an increase in harmful bacteria that are normally present in smaller amounts in the vagina. Several different kinds of bacteria can cause bacterial vaginosis. However, the reason that the condition develops is not fully understood. RISK FACTORS Certain activities or behaviors can put you at an increased risk of developing bacterial vaginosis, including:  Having a new sex partner or multiple sex partners.  Douching.  Using an intrauterine device (IUD) for contraception. Women do not get bacterial vaginosis from toilet seats, bedding, swimming pools, or contact with objects around them. SIGNS AND SYMPTOMS  Some women with bacterial vaginosis have no signs or symptoms. Common symptoms include:  Grey vaginal discharge.  A fishlike odor with discharge, especially after sexual intercourse.  Itching or burning of the vagina and vulva.  Burning or pain with urination. DIAGNOSIS  Your health care provider will take a medical history and examine the vagina for signs of bacterial vaginosis. A sample of vaginal fluid may be taken. Your health care provider will look at this sample under a microscope to check for bacteria and abnormal cells. A vaginal pH test may also be done.  TREATMENT  Bacterial vaginosis may be treated with antibiotic medicines. These may be given in the form of a pill or a vaginal cream. A second round of antibiotics may be prescribed if the condition comes back after treatment.  HOME CARE INSTRUCTIONS   Only take over-the-counter or prescription medicines as  directed by your health care provider.  If antibiotic medicine was prescribed, take it as directed. Make sure you finish it even if you start to feel better.  Do not have sex until treatment is completed.  Tell all sexual partners that you have a vaginal infection. They should see their health care provider and be treated if they have problems, such as a mild rash or itching.  Practice safe sex by using condoms and only having one sex partner. SEEK MEDICAL CARE IF:   Your symptoms are not improving after 3 days of treatment.  You have increased discharge or pain.  You have a fever. MAKE SURE YOU:   Understand these instructions.  Will watch your condition.  Will get help right away if you are not doing well or get worse. FOR MORE INFORMATION  Centers for Disease Control and Prevention, Division of STD Prevention: [employer]Apps.[employer] American Sexual Health Association (ASHA): www.ashastd.org  Document Released: 08/15/2005 Document Revised: 06/05/2013 Document Reviewed: 03/27/2013 [facility] Patient Information 2015 [place], [place]. This information is not intended to replace advice given to you by your health care provider. Make sure you discuss any questions you have with your health care provider. Follow up as scheduled

## 2015-05-05 ENCOUNTER — Ambulatory Visit (INDEPENDENT_AMBULATORY_CARE_PROVIDER_SITE_OTHER): Payer: Medicaid Other | Admitting: Women's Health

## 2015-05-05 ENCOUNTER — Encounter: Payer: Self-pay | Admitting: Women's Health

## 2015-05-05 VITALS — BP 114/60 | HR 72 | Wt 150.0 lb

## 2015-05-05 DIAGNOSIS — Z331 Pregnant state, incidental: Secondary | ICD-10-CM

## 2015-05-05 DIAGNOSIS — Z3682 Encounter for antenatal screening for nuchal translucency: Secondary | ICD-10-CM

## 2015-05-05 DIAGNOSIS — Z1389 Encounter for screening for other disorder: Secondary | ICD-10-CM

## 2015-05-05 DIAGNOSIS — Z3402 Encounter for supervision of normal first pregnancy, second trimester: Secondary | ICD-10-CM

## 2015-05-05 DIAGNOSIS — Z363 Encounter for antenatal screening for malformations: Secondary | ICD-10-CM

## 2015-05-05 LAB — POCT URINALYSIS DIPSTICK
Blood, UA: NEGATIVE
GLUCOSE UA: NEGATIVE
Ketones, UA: NEGATIVE
Leukocytes, UA: NEGATIVE
Nitrite, UA: NEGATIVE
Protein, UA: NEGATIVE

## 2015-05-05 NOTE — Progress Notes (Signed)
Low-risk OB appointment G1P0 [redacted]w[redacted]d Estimated Date of Delivery: 10/18/15 BP 114/60 mmHg  Pulse 72  Wt 150 lb (68.04 kg)  LMP 01/11/2015 (Exact Date)  BP, weight, and urine reviewed.  Refer to obstetrical flow sheet for FH & FHR.  No fm yet. Denies cramping, lof, or uti s/s. Some spotting after last work-in visit for f/u ED visit dx w/ BV. None since then, reassured.  Reviewed warning s/s to report.  Plan:  Continue routine obstetrical care  F/U in 4wks for OB appointment and anatomy u/s 2nd IT today

## 2015-05-05 NOTE — Patient Instructions (Signed)
Second Trimester of Pregnancy The second trimester is from week 13 through week 28, months 4 through 6. The second trimester is often a time when you feel your best. Your body has also adjusted to being pregnant, and you begin to feel better physically. Usually, morning sickness has lessened or quit completely, you may have more energy, and you may have an increase in appetite. The second trimester is also a time when the fetus is growing rapidly. At the end of the sixth month, the fetus is about 9 inches long and weighs about 1 pounds. You will likely begin to feel the baby move (quickening) between 18 and 20 weeks of the pregnancy. BODY CHANGES Your body goes through many changes during pregnancy. The changes vary from woman to woman.   Your weight will continue to increase. You will notice your lower abdomen bulging out.  You may begin to get stretch marks on your hips, abdomen, and breasts.  You may develop headaches that can be relieved by medicines approved by your health care provider.  You may urinate more often because the fetus is pressing on your bladder.  You may develop or continue to have heartburn as a result of your pregnancy.  You may develop constipation because certain hormones are causing the muscles that push waste through your intestines to slow down.  You may develop hemorrhoids or swollen, bulging veins (varicose veins).  You may have back pain because of the weight gain and pregnancy hormones relaxing your joints between the bones in your pelvis and as a result of a shift in weight and the muscles that support your balance.  Your breasts will continue to grow and be tender.  Your gums may bleed and may be sensitive to brushing and flossing.  Dark spots or blotches (chloasma, mask of pregnancy) may develop on your face. This will likely fade after the baby is born.  A dark line from your belly button to the pubic area (linea nigra) may appear. This will likely fade  after the baby is born.  You may have changes in your hair. These can include thickening of your hair, rapid growth, and changes in texture. Some women also have hair loss during or after pregnancy, or hair that feels dry or thin. Your hair will most likely return to normal after your baby is born. WHAT TO EXPECT AT YOUR PRENATAL VISITS During a routine prenatal visit:  You will be weighed to make sure you and the fetus are growing normally.  Your blood pressure will be taken.  Your abdomen will be measured to track your baby's growth.  The fetal heartbeat will be listened to.  Any test results from the previous visit will be discussed. Your health care provider may ask you:  How you are feeling.  If you are feeling the baby move.  If you have had any abnormal symptoms, such as leaking fluid, bleeding, severe headaches, or abdominal cramping.  If you have any questions. Other tests that may be performed during your second trimester include:  Blood tests that check for:  Low iron levels (anemia).  Gestational diabetes (between 24 and 28 weeks).  Rh antibodies.  Urine tests to check for infections, diabetes, or protein in the urine.  An ultrasound to confirm the proper growth and development of the baby.  An amniocentesis to check for possible genetic problems.  Fetal screens for spina bifida and Down syndrome. HOME CARE INSTRUCTIONS   Avoid all smoking, herbs, alcohol, and unprescribed   drugs. These chemicals affect the formation and growth of the baby.  Follow your health care provider's instructions regarding medicine use. There are medicines that are either safe or unsafe to take during pregnancy.  Exercise only as directed by your health care provider. Experiencing uterine cramps is a good sign to stop exercising.  Continue to eat regular, healthy meals.  Wear a good support bra for breast tenderness.  Do not use hot tubs, steam rooms, or saunas.  Wear your  seat belt at all times when driving.  Avoid raw meat, uncooked cheese, cat litter boxes, and soil used by cats. These carry germs that can cause birth defects in the baby.  Take your prenatal vitamins.  Try taking a stool softener (if your health care provider approves) if you develop constipation. Eat more high-fiber foods, such as fresh vegetables or fruit and whole grains. Drink plenty of fluids to keep your urine clear or pale yellow.  Take warm sitz baths to soothe any pain or discomfort caused by hemorrhoids. Use hemorrhoid cream if your health care provider approves.  If you develop varicose veins, wear support hose. Elevate your feet for 15 minutes, 3-4 times a day. Limit salt in your diet.  Avoid heavy lifting, wear low heel shoes, and practice good posture.  Rest with your legs elevated if you have leg cramps or low back pain.  Visit your dentist if you have not gone yet during your pregnancy. Use a soft toothbrush to brush your teeth and be gentle when you floss.  A sexual relationship may be continued unless your health care provider directs you otherwise.  Continue to go to all your prenatal visits as directed by your health care provider. SEEK MEDICAL CARE IF:   You have dizziness.  You have mild pelvic cramps, pelvic pressure, or nagging pain in the abdominal area.  You have persistent nausea, vomiting, or diarrhea.  You have a bad smelling vaginal discharge.  You have pain with urination. SEEK IMMEDIATE MEDICAL CARE IF:   You have a fever.  You are leaking fluid from your vagina.  You have spotting or bleeding from your vagina.  You have severe abdominal cramping or pain.  You have rapid weight gain or loss.  You have shortness of breath with chest pain.  You notice sudden or extreme swelling of your face, hands, ankles, feet, or legs.  You have not felt your baby move in over an hour.  You have severe headaches that do not go away with  medicine.  You have vision changes. Document Released: 08/09/2001 Document Revised: 08/20/2013 Document Reviewed: 10/16/2012 [employer] Patient Information 2015 [employer]. This information is not intended to replace advice given to you by your health care provider. Make sure you discuss any questions you have with your health care provider.  

## 2015-05-07 LAB — MATERNAL SCREEN, INTEGRATED #2
AFP MARKER: 52.1 ng/mL
AFP MoM: 1.45
CROWN RUMP LENGTH: 61.1 mm
DIA MOM: 0.43
DIA Value: 74.7 pg/mL
ESTRIOL UNCONJUGATED: 0.65 ng/mL
GESTATIONAL AGE: 16.6 wk
Gest. Age on Collection Date: 12.6 weeks
Maternal Age at EDD: 24 years
NUCHAL TRANSLUCENCY MOM: 0.74
Nuchal Translucency (NT): 1.1 mm
Number of Fetuses: 1
PAPP-A MOM: 0.73
PAPP-A VALUE: 739.5 ng/mL
Test Results:: NEGATIVE
Weight: 149 [lb_av]
Weight: 150 [lb_av]
hCG MoM: 0.31
hCG Value: 9.8 IU/mL
uE3 MoM: 0.68

## 2015-05-12 ENCOUNTER — Telehealth: Payer: Self-pay | Admitting: *Deleted

## 2015-05-12 NOTE — Telephone Encounter (Signed)
Pt states has a sore throat and last night and this morning had nausea and spit up some clear mucus.  Advised pt to try Tylenol, try either Mucinex or Sudafed, push fluids, warm salt water gargles or warm tea with honey and lemon.  If symptoms get worse or not improving to call us back and we will get her in to be seen.  Pt verbalized understanding.

## 2015-06-02 ENCOUNTER — Ambulatory Visit (INDEPENDENT_AMBULATORY_CARE_PROVIDER_SITE_OTHER): Payer: Medicaid Other | Admitting: Women's Health

## 2015-06-02 ENCOUNTER — Ambulatory Visit (INDEPENDENT_AMBULATORY_CARE_PROVIDER_SITE_OTHER): Payer: Medicaid Other

## 2015-06-02 ENCOUNTER — Encounter: Payer: Self-pay | Admitting: Women's Health

## 2015-06-02 VITALS — BP 98/58 | HR 68 | Wt 154.0 lb

## 2015-06-02 DIAGNOSIS — R350 Frequency of micturition: Secondary | ICD-10-CM

## 2015-06-02 DIAGNOSIS — Z363 Encounter for antenatal screening for malformations: Secondary | ICD-10-CM

## 2015-06-02 DIAGNOSIS — Z23 Encounter for immunization: Secondary | ICD-10-CM

## 2015-06-02 DIAGNOSIS — Z3402 Encounter for supervision of normal first pregnancy, second trimester: Secondary | ICD-10-CM

## 2015-06-02 DIAGNOSIS — Z36 Encounter for antenatal screening of mother: Secondary | ICD-10-CM | POA: Diagnosis not present

## 2015-06-02 DIAGNOSIS — Z1389 Encounter for screening for other disorder: Secondary | ICD-10-CM

## 2015-06-02 DIAGNOSIS — Z331 Pregnant state, incidental: Secondary | ICD-10-CM

## 2015-06-02 LAB — POCT URINALYSIS DIPSTICK
Blood, UA: NEGATIVE
Glucose, UA: NEGATIVE
KETONES UA: NEGATIVE
LEUKOCYTES UA: NEGATIVE
Nitrite, UA: NEGATIVE
PROTEIN UA: NEGATIVE

## 2015-06-02 NOTE — Progress Notes (Signed)
Low-risk OB appointment G1P0 [redacted]w[redacted]d Estimated Date of Delivery: 10/18/15 BP 98/58 mmHg  Pulse 68  Wt 154 lb (69.854 kg)  LMP 01/11/2015 (Exact Date)  BP, weight, and urine reviewed.  Refer to obstetrical flow sheet for FH & FHR.  Reports good fm.  Denies regular uc's, lof, vb, or uti s/s. Had some lower abdominal/pelvic pain yesterday w/ frequent urination. Feels better today. UA dip today neg, will send cx.  Reviewed today's normal anatomy u/s, ptl s/s, fm. Plan:  Continue routine obstetrical care  F/U in 4wks for OB appointment  Flu shot today

## 2015-06-02 NOTE — Patient Instructions (Signed)
Second Trimester of Pregnancy The second trimester is from week 13 through week 28, months 4 through 6. The second trimester is often a time when you feel your best. Your body has also adjusted to being pregnant, and you begin to feel better physically. Usually, morning sickness has lessened or quit completely, you may have more energy, and you may have an increase in appetite. The second trimester is also a time when the fetus is growing rapidly. At the end of the sixth month, the fetus is about 9 inches long and weighs about 1 pounds. You will likely begin to feel the baby move (quickening) between 18 and 20 weeks of the pregnancy. BODY CHANGES Your body goes through many changes during pregnancy. The changes vary from woman to woman.   Your weight will continue to increase. You will notice your lower abdomen bulging out.  You may begin to get stretch marks on your hips, abdomen, and breasts.  You may develop headaches that can be relieved by medicines approved by your health care provider.  You may urinate more often because the fetus is pressing on your bladder.  You may develop or continue to have heartburn as a result of your pregnancy.  You may develop constipation because certain hormones are causing the muscles that push waste through your intestines to slow down.  You may develop hemorrhoids or swollen, bulging veins (varicose veins).  You may have back pain because of the weight gain and pregnancy hormones relaxing your joints between the bones in your pelvis and as a result of a shift in weight and the muscles that support your balance.  Your breasts will continue to grow and be tender.  Your gums may bleed and may be sensitive to brushing and flossing.  Dark spots or blotches (chloasma, mask of pregnancy) may develop on your face. This will likely fade after the baby is born.  A dark line from your belly button to the pubic area (linea nigra) may appear. This will likely fade  after the baby is born.  You may have changes in your hair. These can include thickening of your hair, rapid growth, and changes in texture. Some women also have hair loss during or after pregnancy, or hair that feels dry or thin. Your hair will most likely return to normal after your baby is born. WHAT TO EXPECT AT YOUR PRENATAL VISITS During a routine prenatal visit:  You will be weighed to make sure you and the fetus are growing normally.  Your blood pressure will be taken.  Your abdomen will be measured to track your baby's growth.  The fetal heartbeat will be listened to.  Any test results from the previous visit will be discussed. Your health care provider may ask you:  How you are feeling.  If you are feeling the baby move.  If you have had any abnormal symptoms, such as leaking fluid, bleeding, severe headaches, or abdominal cramping.  If you have any questions. Other tests that may be performed during your second trimester include:  Blood tests that check for:  Low iron levels (anemia).  Gestational diabetes (between 24 and 28 weeks).  Rh antibodies.  Urine tests to check for infections, diabetes, or protein in the urine.  An ultrasound to confirm the proper growth and development of the baby.  An amniocentesis to check for possible genetic problems.  Fetal screens for spina bifida and Down syndrome. HOME CARE INSTRUCTIONS   Avoid all smoking, herbs, alcohol, and unprescribed   drugs. These chemicals affect the formation and growth of the baby.  Follow your health care provider's instructions regarding medicine use. There are medicines that are either safe or unsafe to take during pregnancy.  Exercise only as directed by your health care provider. Experiencing uterine cramps is a good sign to stop exercising.  Continue to eat regular, healthy meals.  Wear a good support bra for breast tenderness.  Do not use hot tubs, steam rooms, or saunas.  Wear your  seat belt at all times when driving.  Avoid raw meat, uncooked cheese, cat litter boxes, and soil used by cats. These carry germs that can cause birth defects in the baby.  Take your prenatal vitamins.  Try taking a stool softener (if your health care provider approves) if you develop constipation. Eat more high-fiber foods, such as fresh vegetables or fruit and whole grains. Drink plenty of fluids to keep your urine clear or pale yellow.  Take warm sitz baths to soothe any pain or discomfort caused by hemorrhoids. Use hemorrhoid cream if your health care provider approves.  If you develop varicose veins, wear support hose. Elevate your feet for 15 minutes, 3-4 times a day. Limit salt in your diet.  Avoid heavy lifting, wear low heel shoes, and practice good posture.  Rest with your legs elevated if you have leg cramps or low back pain.  Visit your dentist if you have not gone yet during your pregnancy. Use a soft toothbrush to brush your teeth and be gentle when you floss.  A sexual relationship may be continued unless your health care provider directs you otherwise.  Continue to go to all your prenatal visits as directed by your health care provider. SEEK MEDICAL CARE IF:   You have dizziness.  You have mild pelvic cramps, pelvic pressure, or nagging pain in the abdominal area.  You have persistent nausea, vomiting, or diarrhea.  You have a bad smelling vaginal discharge.  You have pain with urination. SEEK IMMEDIATE MEDICAL CARE IF:   You have a fever.  You are leaking fluid from your vagina.  You have spotting or bleeding from your vagina.  You have severe abdominal cramping or pain.  You have rapid weight gain or loss.  You have shortness of breath with chest pain.  You notice sudden or extreme swelling of your face, hands, ankles, feet, or legs.  You have not felt your baby move in over an hour.  You have severe headaches that do not go away with  medicine.  You have vision changes. Document Released: 08/09/2001 Document Revised: 08/20/2013 Document Reviewed: 10/16/2012 [employer] Patient Information 2015 [employer]. This information is not intended to replace advice given to you by your health care provider. Make sure you discuss any questions you have with your health care provider.  

## 2015-06-02 NOTE — Progress Notes (Signed)
[place] 20+2 wks,measurements c/w dates,breech,ant pl gr 0,cx 3.4cm,5.7cm svp of fluid,fhr 155bpm,efw 374g,anatomy complete,no obvious abn seen

## 2015-06-03 ENCOUNTER — Encounter (HOSPITAL_COMMUNITY): Payer: Self-pay | Admitting: *Deleted

## 2015-06-03 ENCOUNTER — Inpatient Hospital Stay (HOSPITAL_COMMUNITY)
Admission: AD | Admit: 2015-06-03 | Discharge: 2015-06-03 | Disposition: A | Discharge status: Home / Self Care | Payer: Medicaid Other | Source: Ambulatory Visit | Attending: Obstetrics and Gynecology | Admitting: Obstetrics and Gynecology

## 2015-06-03 DIAGNOSIS — Z3A20 20 weeks gestation of pregnancy: Secondary | ICD-10-CM | POA: Insufficient documentation

## 2015-06-03 DIAGNOSIS — N949 Unspecified condition associated with female genital organs and menstrual cycle: Secondary | ICD-10-CM | POA: Diagnosis not present

## 2015-06-03 DIAGNOSIS — O9989 Other specified diseases and conditions complicating pregnancy, childbirth and the puerperium: Secondary | ICD-10-CM | POA: Diagnosis not present

## 2015-06-03 DIAGNOSIS — R109 Unspecified abdominal pain: Secondary | ICD-10-CM | POA: Diagnosis present

## 2015-06-03 DIAGNOSIS — O26892 Other specified pregnancy related conditions, second trimester: Secondary | ICD-10-CM | POA: Insufficient documentation

## 2015-06-03 DIAGNOSIS — R102 Pelvic and perineal pain: Secondary | ICD-10-CM | POA: Diagnosis not present

## 2015-06-03 DIAGNOSIS — R1030 Lower abdominal pain, unspecified: Secondary | ICD-10-CM | POA: Insufficient documentation

## 2015-06-03 DIAGNOSIS — O26899 Other specified pregnancy related conditions, unspecified trimester: Secondary | ICD-10-CM

## 2015-06-03 LAB — URINALYSIS, ROUTINE W REFLEX MICROSCOPIC
Bilirubin Urine: NEGATIVE
Glucose, UA: NEGATIVE mg/dL
Ketones, ur: 15 mg/dL — AB
LEUKOCYTES UA: NEGATIVE
NITRITE: NEGATIVE
PH: 6.5 (ref 5.0–8.0)
Protein, ur: NEGATIVE mg/dL
SPECIFIC GRAVITY, URINE: 1.01 (ref 1.005–1.030)
Urobilinogen, UA: 0.2 mg/dL (ref 0.0–1.0)

## 2015-06-03 LAB — URINE MICROSCOPIC-ADD ON

## 2015-06-03 LAB — URINE CULTURE: Organism ID, Bacteria: NO GROWTH

## 2015-06-03 NOTE — MAU Note (Signed)
Pt presents to MAU with complaints of pain in the left side of her abdomen. Denies any vaginal bleeding or vaginal discharge

## 2015-06-03 NOTE — Discharge Instructions (Signed)
Round Ligament Pain  The round ligament is a cord of muscle and tissue that helps to support the uterus. It can become a source of pain during pregnancy if it becomes stretched or twisted as the baby grows. The pain usually begins in the second trimester of pregnancy, and it can come and go until the baby is delivered. It is not a serious problem, and it does not cause harm to the baby.  Round ligament pain is usually a short, sharp, and pinching pain, but it can also be a dull, lingering, and aching pain. The pain is felt in the lower side of the abdomen or in the groin. It usually starts deep in the groin and moves up to the outside of the hip area. Pain can occur with:   A sudden change in position.   Rolling over in bed.   Coughing or sneezing.   Physical activity.  HOME CARE INSTRUCTIONS  Watch your condition for any changes. Take these steps to help with your pain:   When the pain starts, relax. Then try:    Sitting down.    Flexing your knees up to your abdomen.    Lying on your side with one pillow under your abdomen and another pillow between your legs.    Sitting in a warm bath for 15-20 minutes or until the pain goes away.   Take over-the-counter and prescription medicines only as told by your health care provider.   Move slowly when you sit and stand.   Avoid long walks if they cause pain.   Stop or lessen your physical activities if they cause pain.  SEEK MEDICAL CARE IF:   Your pain does not go away with treatment.   You feel pain in your back that you did not have before.   Your medicine is not helping.  SEEK IMMEDIATE MEDICAL CARE IF:   You develop a fever or chills.   You develop uterine contractions.   You develop vaginal bleeding.   You develop nausea or vomiting.   You develop diarrhea.   You have pain when you urinate.     This information is not intended to replace advice given to you by your health care provider. Make sure you discuss any questions you have with your health  care provider.     Document Released: 05/24/2008 Document Revised: 11/07/2011 Document Reviewed: 10/22/2014  Elsevier Interactive Patient Education 2016 [employer] Inc[employer]

## 2015-06-03 NOTE — MAU Provider Note (Signed)
History     CSN: [id number]  Arrival date and time: 06/03/15 1540   First Provider Initiated Contact with Patient 06/03/15 1658         Chief Complaint  Patient presents with  . Abdominal Pain   HPI Sandy Peters is a 24 y.o. G1P0 at [redacted]w[redacted]d who presents with abdominal pain.  Left lower abdominal pain that is worse with position changes and walking; also worse while she's at work. Denies pain at this time.  Denies vaginal bleeding, LOF, or vaginal discharge.  Denies n/v/d/constipation.  Denies urinary complaints.  Last BM earlier today.  Positive fetal movement.     OB History    Gravida Para Term Preterm AB TAB SAB Ectopic Multiple Living   1               Past Medical History  Diagnosis Date  . Pregnant 03/03/2015  . Medical history non-contributory     Past Surgical History  Procedure Laterality Date  . No past surgeries      Family History  Problem Relation Age of Onset  . Hypertension Mother   . Anemia Maternal Grandmother   . Hypertension Maternal Grandmother   . Thyroid disease Maternal Grandmother   . Cancer Maternal Grandfather     lung, brain    Social History  Substance Use Topics  . Smoking status: Never Smoker   . Smokeless tobacco: Never Used  . Alcohol Use: No    Allergies: No Known Allergies  Prescriptions prior to admission  Medication Sig Dispense Refill Last Dose  . prenatal vitamin w/FE, FA (PRENATAL 1 + 1) 27-1 MG TABS tablet Take 1 tablet by mouth daily at 12 noon. 30 each 11 06/02/2015 at Unknown time    Review of Systems  Constitutional: Negative.   Respiratory: Negative.   Cardiovascular: Negative.   Gastrointestinal: Positive for abdominal pain. Negative for nausea, vomiting, diarrhea and constipation.  Genitourinary: Negative.    Physical Exam   Blood pressure 113/62, pulse 90, temperature 97.6 F (36.4 C), resp. rate 16, last menstrual period 01/11/2015.   FHT 145 by doppler  Physical Exam  Nursing note and  vitals reviewed. Constitutional: She is oriented to person, place, and time. She appears well-developed and well-nourished. No distress.  HENT:  Head: Normocephalic and atraumatic.  Eyes: Conjunctivae are normal. Right eye exhibits no discharge. Left eye exhibits no discharge. No scleral icterus.  Neck: Normal range of motion.  Cardiovascular: Normal rate, regular rhythm and normal heart sounds.   No murmur heard. Respiratory: Effort normal and breath sounds normal. No respiratory distress. She has no wheezes.  GI: Soft. Bowel sounds are normal. There is no tenderness. There is no rebound.  Neurological: She is alert and oriented to person, place, and time.  Skin: Skin is warm and dry. She is not diaphoretic.  Psychiatric: She has a normal mood and affect. Her behavior is normal. Judgment and thought content normal.    MAU Course  Procedures Results for orders placed or performed during the hospital encounter of 06/03/15 (from the past 24 hour(s))  Urinalysis, Routine w reflex microscopic (not at [facility])     Status: Abnormal   Collection Time: 06/03/15  3:55 PM  Result Value Ref Range   Color, Urine YELLOW YELLOW   APPearance CLEAR CLEAR   Specific Gravity, Urine 1.010 1.005 - 1.030   pH 6.5 5.0 - 8.0   Glucose, UA NEGATIVE NEGATIVE mg/dL   Hgb urine dipstick TRACE (A) NEGATIVE  Bilirubin Urine NEGATIVE NEGATIVE   Ketones, ur 15 (A) NEGATIVE mg/dL   Protein, ur NEGATIVE NEGATIVE mg/dL   Urobilinogen, UA 0.2 0.0 - 1.0 mg/dL   Nitrite NEGATIVE NEGATIVE   Leukocytes, UA NEGATIVE NEGATIVE  Urine microscopic-add on     Status: None   Collection Time: 06/03/15  3:55 PM  Result Value Ref Range   Squamous Epithelial / LPF RARE RARE   RBC / HPF 0-2 <3 RBC/hpf    MDM FHT 145 by doppler No pain at this time Assessment and Plan  A: 1. Pain of round ligament affecting pregnancy, antepartum    P: Discharge home Recommended maternity support belt Change position slowly Discussed  reasons to return to MAU Keep schedule appt with [facility]  Judeth Horn, NP  06/03/2015, 4:40 PM

## 2015-07-02 ENCOUNTER — Ambulatory Visit (INDEPENDENT_AMBULATORY_CARE_PROVIDER_SITE_OTHER): Payer: Medicaid Other | Admitting: Advanced Practice Midwife

## 2015-07-02 ENCOUNTER — Encounter: Payer: Self-pay | Admitting: Advanced Practice Midwife

## 2015-07-02 VITALS — BP 100/64 | HR 84 | Wt 160.0 lb

## 2015-07-02 DIAGNOSIS — Z331 Pregnant state, incidental: Secondary | ICD-10-CM

## 2015-07-02 DIAGNOSIS — Z3402 Encounter for supervision of normal first pregnancy, second trimester: Secondary | ICD-10-CM

## 2015-07-02 DIAGNOSIS — Z1389 Encounter for screening for other disorder: Secondary | ICD-10-CM

## 2015-07-02 LAB — POCT URINALYSIS DIPSTICK
Glucose, UA: NEGATIVE
KETONES UA: NEGATIVE
Leukocytes, UA: NEGATIVE
Nitrite, UA: NEGATIVE
PROTEIN UA: NEGATIVE
RBC UA: NEGATIVE

## 2015-07-02 NOTE — Patient Instructions (Signed)

## 2015-07-02 NOTE — Progress Notes (Signed)
Pt denies any problems or concerns at this time.  

## 2015-07-02 NOTE — Progress Notes (Signed)
G1P0 3656w4d Estimated Date of Delivery: 10/18/15  Blood pressure 100/64, pulse 84, weight 160 lb (72.576 kg), last menstrual period 01/11/2015.   BP weight and urine results all reviewed and noted.  Please refer to the obstetrical flow sheet for the fundal height and fetal heart rate documentation:  Patient reports good fetal movement, denies any bleeding and no rupture of membranes symptoms or regular contractions. Patient is without complaints. All questions were answered.  Orders Placed This Encounter  Procedures  . POCT urinalysis dipstick    Plan:  Continued routine obstetrical care,   Return in about 3 weeks (around 07/23/2015) for PN2/LROB.

## 2015-07-21 ENCOUNTER — Other Ambulatory Visit: Payer: Medicaid Other

## 2015-07-21 ENCOUNTER — Ambulatory Visit (INDEPENDENT_AMBULATORY_CARE_PROVIDER_SITE_OTHER): Payer: Medicaid Other | Admitting: Women's Health

## 2015-07-21 ENCOUNTER — Encounter: Payer: Self-pay | Admitting: Women's Health

## 2015-07-21 VITALS — BP 102/60 | HR 64 | Wt 173.0 lb

## 2015-07-21 DIAGNOSIS — Z131 Encounter for screening for diabetes mellitus: Secondary | ICD-10-CM

## 2015-07-21 DIAGNOSIS — Z1389 Encounter for screening for other disorder: Secondary | ICD-10-CM

## 2015-07-21 DIAGNOSIS — Z3402 Encounter for supervision of normal first pregnancy, second trimester: Secondary | ICD-10-CM

## 2015-07-21 DIAGNOSIS — Z331 Pregnant state, incidental: Secondary | ICD-10-CM

## 2015-07-21 DIAGNOSIS — Z369 Encounter for antenatal screening, unspecified: Secondary | ICD-10-CM

## 2015-07-21 LAB — POCT URINALYSIS DIPSTICK
Blood, UA: NEGATIVE
Glucose, UA: NEGATIVE
KETONES UA: NEGATIVE
LEUKOCYTES UA: NEGATIVE
Nitrite, UA: NEGATIVE
Protein, UA: NEGATIVE

## 2015-07-21 NOTE — Patient Instructions (Signed)

## 2015-07-21 NOTE — Progress Notes (Signed)
Low-risk OB appointment G1P0 65103w2d Estimated Date of Delivery: 10/18/15 BP 102/60 mmHg  Pulse 64  Wt 173 lb (78.472 kg)  LMP 01/11/2015 (Exact Date)  BP, weight, and urine reviewed.  Refer to obstetrical flow sheet for FH & FHR.  Reports good fm.  Denies regular uc's, lof, vb, or uti s/s. No complaints. Reviewed ptl s/s, fkc. Recommended Tdap at HD/PCP per CDC guidelines.  Plan:  Continue routine obstetrical care  F/U in 4wks for OB appointment  PN2 today

## 2015-07-22 ENCOUNTER — Other Ambulatory Visit: Payer: Self-pay | Admitting: Women's Health

## 2015-07-22 ENCOUNTER — Telehealth: Payer: Self-pay | Admitting: *Deleted

## 2015-07-22 LAB — CBC
HEMATOCRIT: 30.5 % — AB (ref 34.0–46.6)
Hemoglobin: 10.2 g/dL — ABNORMAL LOW (ref 11.1–15.9)
MCH: 30.7 pg (ref 26.6–33.0)
MCHC: 33.4 g/dL (ref 31.5–35.7)
MCV: 92 fL (ref 79–97)
PLATELETS: 183 10*3/uL (ref 150–379)
RBC: 3.32 x10E6/uL — AB (ref 3.77–5.28)
RDW: 13.5 % (ref 12.3–15.4)
WBC: 7.6 10*3/uL (ref 3.4–10.8)

## 2015-07-22 LAB — RPR: RPR Ser Ql: NONREACTIVE

## 2015-07-22 LAB — ANTIBODY SCREEN: ANTIBODY SCREEN: NEGATIVE

## 2015-07-22 LAB — GLUCOSE TOLERANCE, 2 HOURS W/ 1HR
GLUCOSE, 1 HOUR: 70 mg/dL (ref 65–179)
Glucose, 2 hour: 73 mg/dL (ref 65–152)
Glucose, Fasting: 78 mg/dL (ref 65–91)

## 2015-07-22 LAB — HIV ANTIBODY (ROUTINE TESTING W REFLEX): HIV Screen 4th Generation wRfx: NONREACTIVE

## 2015-07-22 MED ORDER — FERROUS SULFATE 325 (65 FE) MG PO TABS: 325.0000 mg | ORAL_TABLET | Freq: Two times a day (BID) | ORAL | Status: DC

## 2015-07-22 NOTE — Telephone Encounter (Signed)
Pt informed anemic, Fe Rx e-scribed, increase foods rich in iron. Pt verbalized understanding.

## 2015-08-18 ENCOUNTER — Encounter: Payer: Self-pay | Admitting: Women's Health

## 2015-08-18 ENCOUNTER — Ambulatory Visit (INDEPENDENT_AMBULATORY_CARE_PROVIDER_SITE_OTHER): Payer: Medicaid Other | Admitting: Women's Health

## 2015-08-18 VITALS — BP 100/60 | HR 79 | Wt 166.0 lb

## 2015-08-18 DIAGNOSIS — Z331 Pregnant state, incidental: Secondary | ICD-10-CM

## 2015-08-18 DIAGNOSIS — Z1389 Encounter for screening for other disorder: Secondary | ICD-10-CM

## 2015-08-18 DIAGNOSIS — Z3403 Encounter for supervision of normal first pregnancy, third trimester: Secondary | ICD-10-CM

## 2015-08-18 LAB — POCT URINALYSIS DIPSTICK
Blood, UA: NEGATIVE
GLUCOSE UA: NEGATIVE
Ketones, UA: NEGATIVE
LEUKOCYTES UA: NEGATIVE
NITRITE UA: NEGATIVE
Protein, UA: NEGATIVE

## 2015-08-18 NOTE — Progress Notes (Signed)
Pt denies any problems or concerns at this time.  

## 2015-08-18 NOTE — Progress Notes (Signed)
Low-risk OB appointment G1P0 3637w2d Estimated Date of Delivery: 10/18/15 BP 100/60 mmHg  Pulse 79  Wt 166 lb (75.297 kg)  LMP 01/11/2015 (Exact Date)  BP, weight, and urine reviewed.  Refer to obstetrical flow sheet for FH & FHR.  Reports good fm.  Denies regular uc's, lof, vb, or uti s/s. Spider & varicose veins on Rt leg, none on left- don't hurt. Can get compression stockings.  Reviewed pn2 results, ptl s/s, fkc. Plan:  Continue routine obstetrical care  F/U in 2wks for OB appointment

## 2015-08-18 NOTE — Patient Instructions (Signed)
Compression stockings  Call the office [phone]) or go to [facility] if:  You begin to have strong, frequent contractions  Your water breaks.  Sometimes it is a big gush of fluid, sometimes it is just a trickle that keeps getting your panties wet or running down your legs  You have vaginal bleeding.  It is normal to have a small amount of spotting if your cervix was checked.   You don't feel your baby moving like normal.  If you don't, get you something to eat and drink and lay down and focus on feeling your baby move.  You should feel at least 10 movements in 2 hours.  If you don't, you should call the office or [facility].    Preterm Labor Information Preterm labor is when labor starts at less than 37 weeks of pregnancy. The normal length of a pregnancy is 39 to 41 weeks. CAUSES Often, there is no identifiable underlying cause as to why a woman goes into preterm labor. One of the most common known causes of preterm labor is infection. Infections of the uterus, cervix, vagina, amniotic sac, bladder, kidney, or even the lungs (pneumonia) can cause labor to start. Other suspected causes of preterm labor include:   Urogenital infections, such as yeast infections and bacterial vaginosis.   Uterine abnormalities (uterine shape, uterine septum, fibroids, or bleeding from the placenta).   A cervix that has been operated on (it may fail to stay closed).   Malformations in the fetus.   Multiple gestations (twins, triplets, and so on).   Breakage of the amniotic sac.  RISK FACTORS  Having a previous history of preterm labor.   Having premature rupture of membranes (PROM).   Having a placenta that covers the opening of the cervix (placenta previa).   Having a placenta that separates from the uterus (placental abruption).   Having a cervix that is too weak to hold the fetus in the uterus (incompetent cervix).   Having too much fluid in the amniotic sac  (polyhydramnios).   Taking illegal drugs or smoking while pregnant.   Not gaining enough weight while pregnant.   Being younger than 2518 and older than 23 years old.   Having a low socioeconomic status.   Being African American. SYMPTOMS Signs and symptoms of preterm labor include:   Menstrual-like cramps, abdominal pain, or back pain.  Uterine contractions that are regular, as frequent as six in an hour, regardless of their intensity (may be mild or painful).  Contractions that start on the top of the uterus and spread down to the lower abdomen and back.   A sense of increased pelvic pressure.   A watery or bloody mucus discharge that comes from the vagina.  TREATMENT Depending on the length of the pregnancy and other circumstances, your health care provider may suggest bed rest. If necessary, there are medicines that can be given to stop contractions and to mature the fetal lungs. If labor happens before 34 weeks of pregnancy, a prolonged hospital stay may be recommended. Treatment depends on the condition of both you and the fetus.  WHAT SHOULD YOU DO IF YOU THINK YOU ARE IN PRETERM LABOR? Call your health care provider right away. You will need to go to the hospital to get checked immediately. HOW CAN YOU PREVENT PRETERM LABOR IN FUTURE PREGNANCIES? You should:   Stop smoking if you smoke.  Maintain healthy weight gain and avoid chemicals and drugs that are not necessary.  Be watchful for any type of infection.  Inform your health care provider if you have a known history of preterm labor.   This information is not intended to replace advice given to you by your health care provider. Make sure you discuss any questions you have with your health care provider.   Document Released: 11/05/2003 Document Revised: 04/17/2013 Document Reviewed: 09/17/2012 Elsevier Interactive Patient Education [employer]

## 2015-08-30 NOTE — L&D Delivery Note (Signed)
Operative Delivery Note At 9:33 PM a viable female was delivered via Vaginal, Spontaneous Delivery.  Presentation: vertex; Position: Left,, Occiput,, Anterior; Station: +3.  Verbal consent: obtained from patient.  Risks and benefits discussed in detail.  Risks include, but are not limited to the risks of anesthesia, bleeding, infection, damage to maternal tissues, fetal cephalhematoma.  There is also the risk of inability to effect vaginal delivery of the head, or shoulder dystocia that cannot be resolved by established maneuvers, leading to the need for emergency cesarean section.  APGAR: 1, 7, 8; weight  .   Placenta status: Intact, Spontaneous.   Cord: 3 vessels with the following complications:none .  Cord pH: 7.20  Anesthesia: Epidural  Instruments: kiwi Episiotomy: Median Lacerations: 1st degree Suture Repair: 2.0 vicryl Est. Blood Loss (mL): 100  Mom to postpartum.  Baby to Couplet care / Skin to Skin.  Sandy Peters 10/11/2015, 9:47 PM

## 2015-09-01 ENCOUNTER — Ambulatory Visit (INDEPENDENT_AMBULATORY_CARE_PROVIDER_SITE_OTHER): Payer: Medicaid Other | Admitting: Women's Health

## 2015-09-01 ENCOUNTER — Encounter: Payer: Self-pay | Admitting: Women's Health

## 2015-09-01 ENCOUNTER — Encounter: Payer: Medicaid Other | Admitting: Women's Health

## 2015-09-01 VITALS — BP 90/56 | HR 88 | Wt 170.0 lb

## 2015-09-01 DIAGNOSIS — Z1389 Encounter for screening for other disorder: Secondary | ICD-10-CM

## 2015-09-01 DIAGNOSIS — Z3403 Encounter for supervision of normal first pregnancy, third trimester: Secondary | ICD-10-CM

## 2015-09-01 DIAGNOSIS — Z331 Pregnant state, incidental: Secondary | ICD-10-CM

## 2015-09-01 LAB — POCT URINALYSIS DIPSTICK
GLUCOSE UA: NEGATIVE
Ketones, UA: NEGATIVE
Leukocytes, UA: NEGATIVE
NITRITE UA: NEGATIVE
Protein, UA: NEGATIVE
RBC UA: NEGATIVE

## 2015-09-01 NOTE — Progress Notes (Signed)
Low-risk OB appointment G1P0 3012w2d Estimated Date of Delivery: 10/18/15 BP 90/56 mmHg  Pulse 88  Wt 170 lb (77.111 kg)  LMP 01/11/2015 (Exact Date)  BP, weight, and urine reviewed.  Refer to obstetrical flow sheet for FH & FHR.  Reports good fm.  Denies regular uc's, lof, vb, or uti s/s. No complaints. Hasn't made it to cb classes, recommended tour Reviewed ptl s/s, fkc. Plan:  Continue routine obstetrical care  F/U in 2wks for OB appointment

## 2015-09-01 NOTE — Patient Instructions (Signed)
Call the office ([phone]) or go to [facility] if:  You begin to have strong, frequent contractions  Your water breaks.  Sometimes it is a big gush of fluid, sometimes it is just a trickle that keeps getting your panties wet or running down your legs  You have vaginal bleeding.  It is normal to have a small amount of spotting if your cervix was checked.   You don't feel your baby moving like normal.  If you don't, get you something to eat and drink and lay down and focus on feeling your baby move.  You should feel at least 10 movements in 2 hours.  If you don't, you should call the office or go to [facility].    Preterm Labor Information Preterm labor is when labor starts at less than 37 weeks of pregnancy. The normal length of a pregnancy is 39 to 41 weeks. CAUSES Often, there is no identifiable underlying cause as to why a woman goes into preterm labor. One of the most common known causes of preterm labor is infection. Infections of the uterus, cervix, vagina, amniotic sac, bladder, kidney, or even the lungs (pneumonia) can cause labor to start. Other suspected causes of preterm labor include:   Urogenital infections, such as yeast infections and bacterial vaginosis.   Uterine abnormalities (uterine shape, uterine septum, fibroids, or bleeding from the placenta).   A cervix that has been operated on (it may fail to stay closed).   Malformations in the fetus.   Multiple gestations (twins, triplets, and so on).   Breakage of the amniotic sac.  RISK FACTORS  Having a previous history of preterm labor.   Having premature rupture of membranes (PROM).   Having a placenta that covers the opening of the cervix (placenta previa).   Having a placenta that separates from the uterus (placental abruption).   Having a cervix that is too weak to hold the fetus in the uterus (incompetent cervix).   Having too much fluid in the amniotic sac (polyhydramnios).   Taking  illegal drugs or smoking while pregnant.   Not gaining enough weight while pregnant.   Being younger than 18 and older than 24 years old.   Having a low socioeconomic status.   Being African American. SYMPTOMS Signs and symptoms of preterm labor include:   Menstrual-like cramps, abdominal pain, or back pain.  Uterine contractions that are regular, as frequent as six in an hour, regardless of their intensity (may be mild or painful).  Contractions that start on the top of the uterus and spread down to the lower abdomen and back.   A sense of increased pelvic pressure.   A watery or bloody mucus discharge that comes from the vagina.  TREATMENT Depending on the length of the pregnancy and other circumstances, your health care provider may suggest bed rest. If necessary, there are medicines that can be given to stop contractions and to mature the fetal lungs. If labor happens before 34 weeks of pregnancy, a prolonged hospital stay may be recommended. Treatment depends on the condition of both you and the fetus.  WHAT SHOULD YOU DO IF YOU THINK YOU ARE IN PRETERM LABOR? Call your health care provider right away. You will need to go to the hospital to get checked immediately. HOW CAN YOU PREVENT PRETERM LABOR IN FUTURE PREGNANCIES? You should:   Stop smoking if you smoke.  Maintain healthy weight gain and avoid chemicals and drugs that are not necessary.  Be watchful for   any type of infection.  Inform your health care provider if you have a known history of preterm labor.   This information is not intended to replace advice given to you by your health care provider. Make sure you discuss any questions you have with your health care provider.   Document Released: 11/05/2003 Document Revised: 04/17/2013 Document Reviewed: 09/17/2012 Elsevier Interactive Patient Education 2016 [employer]  

## 2015-09-07 ENCOUNTER — Telehealth: Payer: Self-pay | Admitting: *Deleted

## 2015-09-07 MED ORDER — OSELTAMIVIR PHOSPHATE 75 MG PO CAPS: 75.0000 mg | ORAL_CAPSULE | Freq: Two times a day (BID) | ORAL | Status: DC

## 2015-09-07 NOTE — Telephone Encounter (Signed)
Pt c/o chills, fever, stuffy nose.  Pt states family member had been diagnosed with the flu. Please advise.

## 2015-09-07 NOTE — Telephone Encounter (Signed)
Pt called in, reports chills, fever, stuffy nose, family member dx w/ flu. Rx tamiflu bid x 5d.  Cheral MarkerKimberly R. Carah Barrientes, CNM, [facility] IncWHNP-BC 09/07/2015 4:12 PM

## 2015-09-15 ENCOUNTER — Ambulatory Visit (INDEPENDENT_AMBULATORY_CARE_PROVIDER_SITE_OTHER): Payer: Medicaid Other | Admitting: Women's Health

## 2015-09-15 ENCOUNTER — Encounter: Payer: Self-pay | Admitting: Women's Health

## 2015-09-15 VITALS — BP 112/72 | HR 88 | Wt 171.0 lb

## 2015-09-15 DIAGNOSIS — Z3403 Encounter for supervision of normal first pregnancy, third trimester: Secondary | ICD-10-CM

## 2015-09-15 DIAGNOSIS — Z331 Pregnant state, incidental: Secondary | ICD-10-CM

## 2015-09-15 DIAGNOSIS — Z1389 Encounter for screening for other disorder: Secondary | ICD-10-CM

## 2015-09-15 DIAGNOSIS — Z3A35 35 weeks gestation of pregnancy: Secondary | ICD-10-CM

## 2015-09-15 LAB — POCT URINALYSIS DIPSTICK
Glucose, UA: NEGATIVE
Ketones, UA: NEGATIVE
LEUKOCYTES UA: NEGATIVE
NITRITE UA: NEGATIVE
PROTEIN UA: NEGATIVE
RBC UA: NEGATIVE

## 2015-09-15 NOTE — Progress Notes (Signed)
Low-risk OB appointment G1P0 [redacted]w[redacted]d Estimated Date of Delivery: 10/18/15 BP 112/72 mmHg  Pulse 88  Wt 171 lb (77.565 kg)  LMP 01/11/2015 (Exact Date)  BP, weight, and urine reviewed.  Refer to obstetrical flow sheet for FH & FHR.  Reports good fm.  Denies regular uc's, lof, vb, or uti s/s. No complaints. Reviewed ptl s/s, fkc. Plan:  Continue routine obstetrical care  F/U in 1wk for OB appointment

## 2015-09-15 NOTE — Progress Notes (Signed)
Pt denies any problems or concerns at this time.  

## 2015-09-15 NOTE — Patient Instructions (Signed)
Call the office ([phone]) or go to [facility] if:  You begin to have strong, frequent contractions  Your water breaks.  Sometimes it is a big gush of fluid, sometimes it is just a trickle that keeps getting your panties wet or running down your legs  You have vaginal bleeding.  It is normal to have a small amount of spotting if your cervix was checked.   You don't feel your baby moving like normal.  If you don't, get you something to eat and drink and lay down and focus on feeling your baby move.  You should feel at least 10 movements in 2 hours.  If you don't, you should call the office or go to [facility].    Preterm Labor Information Preterm labor is when labor starts at less than 37 weeks of pregnancy. The normal length of a pregnancy is 39 to 41 weeks. CAUSES Often, there is no identifiable underlying cause as to why a woman goes into preterm labor. One of the most common known causes of preterm labor is infection. Infections of the uterus, cervix, vagina, amniotic sac, bladder, kidney, or even the lungs (pneumonia) can cause labor to start. Other suspected causes of preterm labor include:   Urogenital infections, such as yeast infections and bacterial vaginosis.   Uterine abnormalities (uterine shape, uterine septum, fibroids, or bleeding from the placenta).   A cervix that has been operated on (it may fail to stay closed).   Malformations in the fetus.   Multiple gestations (twins, triplets, and so on).   Breakage of the amniotic sac.  RISK FACTORS  Having a previous history of preterm labor.   Having premature rupture of membranes (PROM).   Having a placenta that covers the opening of the cervix (placenta previa).   Having a placenta that separates from the uterus (placental abruption).   Having a cervix that is too weak to hold the fetus in the uterus (incompetent cervix).   Having too much fluid in the amniotic sac (polyhydramnios).   Taking  illegal drugs or smoking while pregnant.   Not gaining enough weight while pregnant.   Being younger than 18 and older than 24 years old.   Having a low socioeconomic status.   Being African American. SYMPTOMS Signs and symptoms of preterm labor include:   Menstrual-like cramps, abdominal pain, or back pain.  Uterine contractions that are regular, as frequent as six in an hour, regardless of their intensity (may be mild or painful).  Contractions that start on the top of the uterus and spread down to the lower abdomen and back.   A sense of increased pelvic pressure.   A watery or bloody mucus discharge that comes from the vagina.  TREATMENT Depending on the length of the pregnancy and other circumstances, your health care provider may suggest bed rest. If necessary, there are medicines that can be given to stop contractions and to mature the fetal lungs. If labor happens before 34 weeks of pregnancy, a prolonged hospital stay may be recommended. Treatment depends on the condition of both you and the fetus.  WHAT SHOULD YOU DO IF YOU THINK YOU ARE IN PRETERM LABOR? Call your health care provider right away. You will need to go to the hospital to get checked immediately. HOW CAN YOU PREVENT PRETERM LABOR IN FUTURE PREGNANCIES? You should:   Stop smoking if you smoke.  Maintain healthy weight gain and avoid chemicals and drugs that are not necessary.  Be watchful for   any type of infection.  Inform your health care provider if you have a known history of preterm labor.   This information is not intended to replace advice given to you by your health care provider. Make sure you discuss any questions you have with your health care provider.   Document Released: 11/05/2003 Document Revised: 04/17/2013 Document Reviewed: 09/17/2012 Elsevier Interactive Patient Education 2016 [employer]  

## 2015-09-22 ENCOUNTER — Ambulatory Visit (INDEPENDENT_AMBULATORY_CARE_PROVIDER_SITE_OTHER): Payer: Medicaid Other | Admitting: Women's Health

## 2015-09-22 ENCOUNTER — Encounter: Payer: Self-pay | Admitting: Women's Health

## 2015-09-22 VITALS — BP 124/68 | HR 60 | Wt 173.0 lb

## 2015-09-22 DIAGNOSIS — Z3403 Encounter for supervision of normal first pregnancy, third trimester: Secondary | ICD-10-CM

## 2015-09-22 DIAGNOSIS — Z331 Pregnant state, incidental: Secondary | ICD-10-CM

## 2015-09-22 DIAGNOSIS — Z1389 Encounter for screening for other disorder: Secondary | ICD-10-CM

## 2015-09-22 LAB — POCT URINALYSIS DIPSTICK
Glucose, UA: NEGATIVE
KETONES UA: NEGATIVE
Leukocytes, UA: NEGATIVE
NITRITE UA: NEGATIVE
Protein, UA: NEGATIVE
RBC UA: NEGATIVE

## 2015-09-22 NOTE — Patient Instructions (Signed)
Call the office ([phone]) or go to [facility] if:  You begin to have strong, frequent contractions  Your water breaks.  Sometimes it is a big gush of fluid, sometimes it is just a trickle that keeps getting your panties wet or running down your legs  You have vaginal bleeding.  It is normal to have a small amount of spotting if your cervix was checked.   You don't feel your baby moving like normal.  If you don't, get you something to eat and drink and lay down and focus on feeling your baby move.  You should feel at least 10 movements in 2 hours.  If you don't, you should call the office or go to [facility].    Preterm Labor Information Preterm labor is when labor starts at less than 37 weeks of pregnancy. The normal length of a pregnancy is 39 to 41 weeks. CAUSES Often, there is no identifiable underlying cause as to why a woman goes into preterm labor. One of the most common known causes of preterm labor is infection. Infections of the uterus, cervix, vagina, amniotic sac, bladder, kidney, or even the lungs (pneumonia) can cause labor to start. Other suspected causes of preterm labor include:   Urogenital infections, such as yeast infections and bacterial vaginosis.   Uterine abnormalities (uterine shape, uterine septum, fibroids, or bleeding from the placenta).   A cervix that has been operated on (it may fail to stay closed).   Malformations in the fetus.   Multiple gestations (twins, triplets, and so on).   Breakage of the amniotic sac.  RISK FACTORS  Having a previous history of preterm labor.   Having premature rupture of membranes (PROM).   Having a placenta that covers the opening of the cervix (placenta previa).   Having a placenta that separates from the uterus (placental abruption).   Having a cervix that is too weak to hold the fetus in the uterus (incompetent cervix).   Having too much fluid in the amniotic sac (polyhydramnios).   Taking  illegal drugs or smoking while pregnant.   Not gaining enough weight while pregnant.   Being younger than 18 and older than 24 years old.   Having a low socioeconomic status.   Being African American. SYMPTOMS Signs and symptoms of preterm labor include:   Menstrual-like cramps, abdominal pain, or back pain.  Uterine contractions that are regular, as frequent as six in an hour, regardless of their intensity (may be mild or painful).  Contractions that start on the top of the uterus and spread down to the lower abdomen and back.   A sense of increased pelvic pressure.   A watery or bloody mucus discharge that comes from the vagina.  TREATMENT Depending on the length of the pregnancy and other circumstances, your health care provider may suggest bed rest. If necessary, there are medicines that can be given to stop contractions and to mature the fetal lungs. If labor happens before 34 weeks of pregnancy, a prolonged hospital stay may be recommended. Treatment depends on the condition of both you and the fetus.  WHAT SHOULD YOU DO IF YOU THINK YOU ARE IN PRETERM LABOR? Call your health care provider right away. You will need to go to the hospital to get checked immediately. HOW CAN YOU PREVENT PRETERM LABOR IN FUTURE PREGNANCIES? You should:   Stop smoking if you smoke.  Maintain healthy weight gain and avoid chemicals and drugs that are not necessary.  Be watchful for   any type of infection.  Inform your health care provider if you have a known history of preterm labor.   This information is not intended to replace advice given to you by your health care provider. Make sure you discuss any questions you have with your health care provider.   Document Released: 11/05/2003 Document Revised: 04/17/2013 Document Reviewed: 09/17/2012 Elsevier Interactive Patient Education 2016 [employer]  

## 2015-09-22 NOTE — Progress Notes (Signed)
Low-risk OB appointment G1P0 [redacted]w[redacted]d Estimated Date of Delivery: 10/18/15 BP 124/68 mmHg  Pulse 60  Wt 173 lb (78.472 kg)  LMP 01/11/2015 (Exact Date)  BP, weight, and urine reviewed.  Refer to obstetrical flow sheet for FH & FHR.  Reports good fm.  Denies regular uc's, lof, vb, or uti s/s. No complaints. Reviewed ptl s/s, fkc. Plan:  Continue routine obstetrical care  F/U in 1wk for OB appointment, no gbs (+urine)

## 2015-09-29 ENCOUNTER — Ambulatory Visit (INDEPENDENT_AMBULATORY_CARE_PROVIDER_SITE_OTHER): Payer: Medicaid Other | Admitting: Women's Health

## 2015-09-29 ENCOUNTER — Encounter: Payer: Self-pay | Admitting: Women's Health

## 2015-09-29 VITALS — BP 104/60 | HR 81 | Wt 174.5 lb

## 2015-09-29 DIAGNOSIS — Z1389 Encounter for screening for other disorder: Secondary | ICD-10-CM

## 2015-09-29 DIAGNOSIS — Z331 Pregnant state, incidental: Secondary | ICD-10-CM

## 2015-09-29 DIAGNOSIS — Z3403 Encounter for supervision of normal first pregnancy, third trimester: Secondary | ICD-10-CM

## 2015-09-29 DIAGNOSIS — Z369 Encounter for antenatal screening, unspecified: Secondary | ICD-10-CM

## 2015-09-29 LAB — POCT URINALYSIS DIPSTICK
Glucose, UA: NEGATIVE
KETONES UA: NEGATIVE
LEUKOCYTES UA: NEGATIVE
Nitrite, UA: NEGATIVE
PROTEIN UA: NEGATIVE
RBC UA: NEGATIVE

## 2015-09-29 NOTE — Progress Notes (Signed)
Pt denies any problems or concerns at this time.  

## 2015-09-29 NOTE — Progress Notes (Signed)
Low-risk OB appointment G1P0 [redacted]w[redacted]d Estimated Date of Delivery: 10/18/15 BP 104/60 mmHg  Pulse 81  Wt 174 lb 8 oz (79.153 kg)  LMP 01/11/2015 (Exact Date)  BP, weight, and urine reviewed.  Refer to obstetrical flow sheet for FH & FHR.  Reports good fm.  Denies regular uc's, lof, vb, or uti s/s. No complaints. SVE per request: LTC, vtx, -2, post Reviewed labor s/s, fkc. Plan:  Continue routine obstetrical care  F/U in 1wk for OB appointment  NO gbs needed, +urine

## 2015-10-01 LAB — GC/CHLAMYDIA PROBE AMP
Chlamydia trachomatis, NAA: NEGATIVE
Neisseria gonorrhoeae by PCR: NEGATIVE

## 2015-10-08 ENCOUNTER — Telehealth: Payer: Self-pay | Admitting: Obstetrics and Gynecology

## 2015-10-08 ENCOUNTER — Encounter (HOSPITAL_COMMUNITY): Payer: Self-pay

## 2015-10-08 ENCOUNTER — Inpatient Hospital Stay (HOSPITAL_COMMUNITY)
Admission: AD | Admit: 2015-10-08 | Discharge: 2015-10-08 | Disposition: A | Discharge status: Home / Self Care | Payer: Medicaid Other | Source: Ambulatory Visit | Attending: Obstetrics and Gynecology | Admitting: Obstetrics and Gynecology

## 2015-10-08 DIAGNOSIS — Z3493 Encounter for supervision of normal pregnancy, unspecified, third trimester: Secondary | ICD-10-CM | POA: Diagnosis present

## 2015-10-08 NOTE — MAU Note (Signed)
Pt reports vaginal bleeding since 2am. Reports the blood is just on the tissue when she wipes.

## 2015-10-08 NOTE — MAU Note (Signed)
Pt c/o vag bleeding that started around 0200-like a period. Denies pain. No hx of problems with placenta during pregnancy. +FM.

## 2015-10-08 NOTE — Telephone Encounter (Signed)
Pt states was seen at [facility] this am for vaginal bleeding, concerned about going back to work. Pt given an appt for tomorrow with Dr. Despina Hidden.

## 2015-10-08 NOTE — Discharge Instructions (Signed)
Braxton Hicks Contractions °Contractions of the uterus can occur throughout pregnancy. Contractions are not always a sign that you are in labor.  °WHAT ARE BRAXTON HICKS CONTRACTIONS?  °Contractions that occur before labor are called Braxton Hicks contractions, or false labor. Toward the end of pregnancy (32-34 weeks), these contractions can develop more often and may become more forceful. This is not true labor because these contractions do not result in opening (dilatation) and thinning of the cervix. They are sometimes difficult to tell apart from true labor because these contractions can be forceful and people have different pain tolerances. You should not feel embarrassed if you go to the hospital with false labor. Sometimes, the only way to tell if you are in true labor is for your health care provider to look for changes in the cervix. °If there are no prenatal problems or other health problems associated with the pregnancy, it is completely safe to be sent home with false labor and await the onset of true labor. °HOW CAN YOU TELL THE DIFFERENCE BETWEEN TRUE AND FALSE LABOR? °False Labor °· The contractions of false labor are usually shorter and not as hard as those of true labor.   °· The contractions are usually irregular.   °· The contractions are often felt in the front of the lower abdomen and in the groin.   °· The contractions may go away when you walk around or change positions while lying down.   °· The contractions get weaker and are shorter lasting as time goes on.   °· The contractions do not usually become progressively stronger, regular, and closer together as with true labor.   °True Labor °1. Contractions in true labor last 30-70 seconds, become very regular, usually become more intense, and increase in frequency.   °2. The contractions do not go away with walking.   °3. The discomfort is usually felt in the top of the uterus and spreads to the lower abdomen and low back.   °4. True labor can  be determined by your health care provider with an exam. This will show that the cervix is dilating and getting thinner.   °WHAT TO REMEMBER °· Keep up with your usual exercises and follow other instructions given by your health care provider.   °· Take medicines as directed by your health care provider.   °· Keep your regular prenatal appointments.   °· Eat and drink lightly if you think you are going into labor.   °· If Braxton Hicks contractions are making you uncomfortable:   °· Change your position from lying down or resting to walking, or from walking to resting.   °· Sit and rest in a tub of warm water.   °· Drink 2-3 glasses of water. Dehydration may cause these contractions.   °· Do slow and deep breathing several times an hour.   °WHEN SHOULD I SEEK IMMEDIATE MEDICAL CARE? °Seek immediate medical care if: °· Your contractions become stronger, more regular, and closer together.   °· You have fluid leaking or gushing from your vagina.   °· You have a fever.   °· You pass blood-tinged mucus.   °· You have vaginal bleeding.   °· You have continuous abdominal pain.   °· You have low back pain that you never had before.   °· You feel your baby's head pushing down and causing pelvic pressure.   °· Your baby is not moving as much as it used to.   °  °This information is not intended to replace advice given to you by your health care provider. Make sure you discuss any questions you have with your health care   provider. °  °Document Released: 08/15/2005 Document Revised: 08/20/2013 Document Reviewed: 05/27/2013 °Elsevier Interactive Patient Education ©2016 [employer] ° °Fetal Movement Counts °Patient Name: __________________________________________________ Patient Due Date: ____________________ °Performing a fetal movement count is highly recommended in high-risk pregnancies, but it is good for every pregnant woman to do. Your health care provider may ask you to start counting fetal movements at 28 weeks of the  pregnancy. Fetal movements often increase: °· After eating a full meal. °· After physical activity. °· After eating or drinking something sweet or cold. °· At rest. °Pay attention to when you feel the baby is most active. This will help you notice a pattern of your baby's sleep and wake cycles and what factors contribute to an increase in fetal movement. It is important to perform a fetal movement count at the same time each day when your baby is normally most active.  °HOW TO COUNT FETAL MOVEMENTS °5. Find a quiet and comfortable area to sit or lie down on your left side. Lying on your left side provides the best blood and oxygen circulation to your baby. °6. Write down the day and time on a sheet of paper or in a journal. °7. Start counting kicks, flutters, swishes, rolls, or jabs in a 2-hour period. You should feel at least 10 movements within 2 hours. °8. If you do not feel 10 movements in 2 hours, wait 2-3 hours and count again. Look for a change in the pattern or not enough counts in 2 hours. °SEEK MEDICAL CARE IF: °· You feel less than 10 counts in 2 hours, tried twice. °· There is no movement in over an hour. °· The pattern is changing or taking longer each day to reach 10 counts in 2 hours. °· You feel the baby is not moving as he or she usually does. °Date: ____________ Movements: ____________ Start time: ____________ Finish time: ____________  °Date: ____________ Movements: ____________ Start time: ____________ Finish time: ____________ °Date: ____________ Movements: ____________ Start time: ____________ Finish time: ____________ °Date: ____________ Movements: ____________ Start time: ____________ Finish time: ____________ °Date: ____________ Movements: ____________ Start time: ____________ Finish time: ____________ °Date: ____________ Movements: ____________ Start time: ____________ Finish time: ____________ °Date: ____________ Movements: ____________ Start time: ____________ Finish time:  ____________ °Date: ____________ Movements: ____________ Start time: ____________ Finish time: ____________  °Date: ____________ Movements: ____________ Start time: ____________ Finish time: ____________ °Date: ____________ Movements: ____________ Start time: ____________ Finish time: ____________ °Date: ____________ Movements: ____________ Start time: ____________ Finish time: ____________ °Date: ____________ Movements: ____________ Start time: ____________ Finish time: ____________ °Date: ____________ Movements: ____________ Start time: ____________ Finish time: ____________ °Date: ____________ Movements: ____________ Start time: ____________ Finish time: ____________ °Date: ____________ Movements: ____________ Start time: ____________ Finish time: ____________  °Date: ____________ Movements: ____________ Start time: ____________ Finish time: ____________ °Date: ____________ Movements: ____________ Start time: ____________ Finish time: ____________ °Date: ____________ Movements: ____________ Start time: ____________ Finish time: ____________ °Date: ____________ Movements: ____________ Start time: ____________ Finish time: ____________ °Date: ____________ Movements: ____________ Start time: ____________ Finish time: ____________ °Date: ____________ Movements: ____________ Start time: ____________ Finish time: ____________ °Date: ____________ Movements: ____________ Start time: ____________ Finish time: ____________  °Date: ____________ Movements: ____________ Start time: ____________ Finish time: ____________ °Date: ____________ Movements: ____________ Start time: ____________ Finish time: ____________ °Date: ____________ Movements: ____________ Start time: ____________ Finish time: ____________ °Date: ____________ Movements: ____________ Start time: ____________ Finish time: ____________ °Date: ____________ Movements: ____________ Start time: ____________ Finish time: ____________ °Date: ____________ Movements:  ____________ Start time: ____________ Finish   time: ____________ °Date: ____________ Movements: ____________ Start time: ____________ Finish time: ____________  °Date: ____________ Movements: ____________ Start time: ____________ Finish time: ____________ °Date: ____________ Movements: ____________ Start time: ____________ Finish time: ____________ °Date: ____________ Movements: ____________ Start time: ____________ Finish time: ____________ °Date: ____________ Movements: ____________ Start time: ____________ Finish time: ____________ °Date: ____________ Movements: ____________ Start time: ____________ Finish time: ____________ °Date: ____________ Movements: ____________ Start time: ____________ Finish time: ____________ °Date: ____________ Movements: ____________ Start time: ____________ Finish time: ____________  °Date: ____________ Movements: ____________ Start time: ____________ Finish time: ____________ °Date: ____________ Movements: ____________ Start time: ____________ Finish time: ____________ °Date: ____________ Movements: ____________ Start time: ____________ Finish time: ____________ °Date: ____________ Movements: ____________ Start time: ____________ Finish time: ____________ °Date: ____________ Movements: ____________ Start time: ____________ Finish time: ____________ °Date: ____________ Movements: ____________ Start time: ____________ Finish time: ____________ °Date: ____________ Movements: ____________ Start time: ____________ Finish time: ____________  °Date: ____________ Movements: ____________ Start time: ____________ Finish time: ____________ °Date: ____________ Movements: ____________ Start time: ____________ Finish time: ____________ °Date: ____________ Movements: ____________ Start time: ____________ Finish time: ____________ °Date: ____________ Movements: ____________ Start time: ____________ Finish time: ____________ °Date: ____________ Movements: ____________ Start time: ____________ Finish  time: ____________ °Date: ____________ Movements: ____________ Start time: ____________ Finish time: ____________ °Date: ____________ Movements: ____________ Start time: ____________ Finish time: ____________  °Date: ____________ Movements: ____________ Start time: ____________ Finish time: ____________ °Date: ____________ Movements: ____________ Start time: ____________ Finish time: ____________ °Date: ____________ Movements: ____________ Start time: ____________ Finish time: ____________ °Date: ____________ Movements: ____________ Start time: ____________ Finish time: ____________ °Date: ____________ Movements: ____________ Start time: ____________ Finish time: ____________ °Date: ____________ Movements: ____________ Start time: ____________ Finish time: ____________ °  °This information is not intended to replace advice given to you by your health care provider. Make sure you discuss any questions you have with your health care provider. °  °Document Released: 09/14/2006 Document Revised: 09/05/2014 Document Reviewed: 06/11/2012 °Elsevier Interactive Patient Education ©2016 [employer] Inc[employer] ° °

## 2015-10-09 ENCOUNTER — Encounter: Payer: Self-pay | Admitting: Obstetrics & Gynecology

## 2015-10-09 ENCOUNTER — Encounter (HOSPITAL_COMMUNITY): Payer: Self-pay

## 2015-10-09 ENCOUNTER — Ambulatory Visit (INDEPENDENT_AMBULATORY_CARE_PROVIDER_SITE_OTHER): Payer: Medicaid Other | Admitting: Obstetrics & Gynecology

## 2015-10-09 ENCOUNTER — Inpatient Hospital Stay (HOSPITAL_COMMUNITY)
Admission: AD | Admit: 2015-10-09 | Discharge: 2015-10-10 | Disposition: A | Discharge status: Home / Self Care | Payer: Medicaid Other | Source: Ambulatory Visit | Attending: Family Medicine | Admitting: Family Medicine

## 2015-10-09 VITALS — BP 110/70 | HR 80 | Wt 175.0 lb

## 2015-10-09 DIAGNOSIS — Z3403 Encounter for supervision of normal first pregnancy, third trimester: Secondary | ICD-10-CM

## 2015-10-09 DIAGNOSIS — Z331 Pregnant state, incidental: Secondary | ICD-10-CM

## 2015-10-09 DIAGNOSIS — Z1389 Encounter for screening for other disorder: Secondary | ICD-10-CM

## 2015-10-09 DIAGNOSIS — Z3A39 39 weeks gestation of pregnancy: Secondary | ICD-10-CM | POA: Diagnosis not present

## 2015-10-09 LAB — POCT URINALYSIS DIPSTICK
GLUCOSE UA: NEGATIVE
Ketones, UA: NEGATIVE
Leukocytes, UA: NEGATIVE
NITRITE UA: NEGATIVE
Protein, UA: NEGATIVE
RBC UA: NEGATIVE

## 2015-10-09 NOTE — Progress Notes (Signed)
G1P0 [redacted]w[redacted]d Estimated Date of Delivery: 10/18/15  Blood pressure 110/70, pulse 80, weight 175 lb (79.379 kg), last menstrual period 01/11/2015.   BP weight and urine results all reviewed and noted.  Please refer to the obstetrical flow sheet for the fundal height and fetal heart rate documentation:  Patient reports good fetal movement, denies any bleeding and no rupture of membranes symptoms or regular contractions. Patient is without complaints. All questions were answered.  Orders Placed This Encounter  Procedures  . POCT urinalysis dipstick    Plan:  Continued routine obstetrical care,  Pt had some vaginal bleeding associated with cervical mucous passage and beyond, now minimal with brown when she wipes  No Follow-up on file.

## 2015-10-09 NOTE — MAU Note (Signed)
Pt presents complaining of contractions for the last 3 hours with bloody show. Denies leaking. Reports good fetal movement. Closed in office today.

## 2015-10-10 MED ORDER — ZOLPIDEM TARTRATE 5 MG PO TABS: 5.0000 mg | ORAL_TABLET | Freq: Once | ORAL | Status: DC

## 2015-10-10 MED ORDER — ZOLPIDEM TARTRATE 5 MG PO TABS
5.0000 mg | ORAL_TABLET | Freq: Every evening | ORAL | Status: DC | PRN
  Administered 2015-10-10: 5 mg via ORAL
  Filled 2015-10-10: qty 1

## 2015-10-10 NOTE — Discharge Instructions (Signed)
Third Trimester of Pregnancy °The third trimester is from week 29 through week 42, months 7 through 9. The third trimester is a time when the fetus is growing rapidly. At the end of the ninth month, the fetus is about 20 inches in length and weighs 6-10 pounds.  °BODY CHANGES °Your body goes through many changes during pregnancy. The changes vary from woman to woman.  °· Your weight will continue to increase. You can expect to gain 25-35 pounds (11-16 kg) by the end of the pregnancy. °· You may begin to get stretch marks on your hips, abdomen, and breasts. °· You may urinate more often because the fetus is moving lower into your pelvis and pressing on your bladder. °· You may develop or continue to have heartburn as a result of your pregnancy. °· You may develop constipation because certain hormones are causing the muscles that push waste through your intestines to slow down. °· You may develop hemorrhoids or swollen, bulging veins (varicose veins). °· You may have pelvic pain because of the weight gain and pregnancy hormones relaxing your joints between the bones in your pelvis. Backaches may result from overexertion of the muscles supporting your posture. °· You may have changes in your hair. These can include thickening of your hair, rapid growth, and changes in texture. Some women also have hair loss during or after pregnancy, or hair that feels dry or thin. Your hair will most likely return to normal after your baby is born. °· Your breasts will continue to grow and be tender. A yellow discharge may leak from your breasts called colostrum. °· Your belly button may stick out. °· You may feel short of breath because of your expanding uterus. °· You may notice the fetus "dropping," or moving lower in your abdomen. °· You may have a bloody mucus discharge. This usually occurs a few days to a week before labor begins. °· Your cervix becomes thin and soft (effaced) near your due date. °WHAT TO EXPECT AT YOUR PRENATAL  EXAMS  °You will have prenatal exams every 2 weeks until week 36. Then, you will have weekly prenatal exams. During a routine prenatal visit: °· You will be weighed to make sure you and the fetus are growing normally. °· Your blood pressure is taken. °· Your abdomen will be measured to track your baby's growth. °· The fetal heartbeat will be listened to. °· Any test results from the previous visit will be discussed. °· You may have a cervical check near your due date to see if you have effaced. °At around 36 weeks, your caregiver will check your cervix. At the same time, your caregiver will also perform a test on the secretions of the vaginal tissue. This test is to determine if a type of bacteria, Group B streptococcus, is present. Your caregiver will explain this further. °Your caregiver may ask you: °· What your birth plan is. °· How you are feeling. °· If you are feeling the baby move. °· If you have had any abnormal symptoms, such as leaking fluid, bleeding, severe headaches, or abdominal cramping. °· If you are using any tobacco products, including cigarettes, chewing tobacco, and electronic cigarettes. °· If you have any questions. °Other tests or screenings that may be performed during your third trimester include: °· Blood tests that check for low iron levels (anemia). °· Fetal testing to check the health, activity level, and growth of the fetus. Testing is done if you have certain medical conditions or if   there are problems during the pregnancy. °· HIV (human immunodeficiency virus) testing. If you are at high risk, you may be screened for HIV during your third trimester of pregnancy. °FALSE LABOR °You may feel small, irregular contractions that eventually go away. These are called Braxton Hicks contractions, or false labor. Contractions may last for hours, days, or even weeks before true labor sets in. If contractions come at regular intervals, intensify, or become painful, it is best to be seen by your  caregiver.  °SIGNS OF LABOR  °· Menstrual-like cramps. °· Contractions that are 5 minutes apart or less. °· Contractions that start on the top of the uterus and spread down to the lower abdomen and back. °· A sense of increased pelvic pressure or back pain. °· A watery or bloody mucus discharge that comes from the vagina. °If you have any of these signs before the 37th week of pregnancy, call your caregiver right away. You need to go to the hospital to get checked immediately. °HOME CARE INSTRUCTIONS  °· Avoid all smoking, herbs, alcohol, and unprescribed drugs. These chemicals affect the formation and growth of the baby. °· Do not use any tobacco products, including cigarettes, chewing tobacco, and electronic cigarettes. If you need help quitting, ask your health care provider. You may receive counseling support and other resources to help you quit. °· Follow your caregiver's instructions regarding medicine use. There are medicines that are either safe or unsafe to take during pregnancy. °· Exercise only as directed by your caregiver. Experiencing uterine cramps is a good sign to stop exercising. °· Continue to eat regular, healthy meals. °· Wear a good support bra for breast tenderness. °· Do not use hot tubs, steam rooms, or saunas. °· Wear your seat belt at all times when driving. °· Avoid raw meat, uncooked cheese, cat litter boxes, and soil used by cats. These carry germs that can cause birth defects in the baby. °· Take your prenatal vitamins. °· Take 1500-2000 mg of calcium daily starting at the 20th week of pregnancy until you deliver your baby. °· Try taking a stool softener (if your caregiver approves) if you develop constipation. Eat more high-fiber foods, such as fresh vegetables or fruit and whole grains. Drink plenty of fluids to keep your urine clear or pale yellow. °· Take warm sitz baths to soothe any pain or discomfort caused by hemorrhoids. Use hemorrhoid cream if your caregiver approves. °· If  you develop varicose veins, wear support hose. Elevate your feet for 15 minutes, 3-4 times a day. Limit salt in your diet. °· Avoid heavy lifting, wear low heal shoes, and practice good posture. °· Rest a lot with your legs elevated if you have leg cramps or low back pain. °· Visit your dentist if you have not gone during your pregnancy. Use a soft toothbrush to brush your teeth and be gentle when you floss. °· A sexual relationship may be continued unless your caregiver directs you otherwise. °· Do not travel far distances unless it is absolutely necessary and only with the approval of your caregiver. °· Take prenatal classes to understand, practice, and ask questions about the labor and delivery. °· Make a trial run to the hospital. °· Pack your hospital bag. °· Prepare the baby's nursery. °· Continue to go to all your prenatal visits as directed by your caregiver. °SEEK MEDICAL CARE IF: °· You are unsure if you are in labor or if your water has broken. °· You have dizziness. °· You have   mild pelvic cramps, pelvic pressure, or nagging pain in your abdominal area. °· You have persistent nausea, vomiting, or diarrhea. °· You have a bad smelling vaginal discharge. °· You have pain with urination. °SEEK IMMEDIATE MEDICAL CARE IF:  °· You have a fever. °· You are leaking fluid from your vagina. °· You have spotting or bleeding from your vagina. °· You have severe abdominal cramping or pain. °· You have rapid weight loss or gain. °· You have shortness of breath with chest pain. °· You notice sudden or extreme swelling of your face, hands, ankles, feet, or legs. °· You have not felt your baby move in over an hour. °· You have severe headaches that do not go away with medicine. °· You have vision changes. °  °This information is not intended to replace advice given to you by your health care provider. Make sure you discuss any questions you have with your health care provider. °  °Document Released: 08/09/2001 Document  Revised: 09/05/2014 Document Reviewed: 10/16/2012 °[employer] Interactive Patient Education ©2016 [employer] °Fetal Movement Counts °Patient Name: __________________________________________________ Patient Due Date: ____________________ °Performing a fetal movement count is highly recommended in high-risk pregnancies, but it is good for every pregnant woman to do. Your health care provider may ask you to start counting fetal movements at 28 weeks of the pregnancy. Fetal movements often increase: °· After eating a full meal. °· After physical activity. °· After eating or drinking something sweet or cold. °· At rest. °Pay attention to when you feel the baby is most active. This will help you notice a pattern of your baby's sleep and wake cycles and what factors contribute to an increase in fetal movement. It is important to perform a fetal movement count at the same time each day when your baby is normally most active.  °HOW TO COUNT FETAL MOVEMENTS °· Find a quiet and comfortable area to sit or lie down on your left side. Lying on your left side provides the best blood and oxygen circulation to your baby. °· Write down the day and time on a sheet of paper or in a journal. °· Start counting kicks, flutters, swishes, rolls, or jabs in a 2-hour period. You should feel at least 10 movements within 2 hours. °· If you do not feel 10 movements in 2 hours, wait 2-3 hours and count again. Look for a change in the pattern or not enough counts in 2 hours. °SEEK MEDICAL CARE IF: °· You feel less than 10 counts in 2 hours, tried twice. °· There is no movement in over an hour. °· The pattern is changing or taking longer each day to reach 10 counts in 2 hours. °· You feel the baby is not moving as he or she usually does. °Date: ____________ Movements: ____________ Start time: ____________ Finish time: ____________  °Date: ____________ Movements: ____________ Start time: ____________ Finish time: ____________ °Date: ____________  Movements: ____________ Start time: ____________ Finish time: ____________ °Date: ____________ Movements: ____________ Start time: ____________ Finish time: ____________ °Date: ____________ Movements: ____________ Start time: ____________ Finish time: ____________ °Date: ____________ Movements: ____________ Start time: ____________ Finish time: ____________ °Date: ____________ Movements: ____________ Start time: ____________ Finish time: ____________ °Date: ____________ Movements: ____________ Start time: ____________ Finish time: ____________  °Date: ____________ Movements: ____________ Start time: ____________ Finish time: ____________ °Date: ____________ Movements: ____________ Start time: ____________ Finish time: ____________ °Date: ____________ Movements: ____________ Start time: ____________ Finish time: ____________ °Date: ____________ Movements: ____________ Start time: ____________ Finish time: ____________ °Date:   ____________ Movements: ____________ Start time: ____________ Finish time: ____________ °Date: ____________ Movements: ____________ Start time: ____________ Finish time: ____________ °Date: ____________ Movements: ____________ Start time: ____________ Finish time: ____________  °Date: ____________ Movements: ____________ Start time: ____________ Finish time: ____________ °Date: ____________ Movements: ____________ Start time: ____________ Finish time: ____________ °Date: ____________ Movements: ____________ Start time: ____________ Finish time: ____________ °Date: ____________ Movements: ____________ Start time: ____________ Finish time: ____________ °Date: ____________ Movements: ____________ Start time: ____________ Finish time: ____________ °Date: ____________ Movements: ____________ Start time: ____________ Finish time: ____________ °Date: ____________ Movements: ____________ Start time: ____________ Finish time: ____________  °Date: ____________ Movements: ____________ Start time:  ____________ Finish time: ____________ °Date: ____________ Movements: ____________ Start time: ____________ Finish time: ____________ °Date: ____________ Movements: ____________ Start time: ____________ Finish time: ____________ °Date: ____________ Movements: ____________ Start time: ____________ Finish time: ____________ °Date: ____________ Movements: ____________ Start time: ____________ Finish time: ____________ °Date: ____________ Movements: ____________ Start time: ____________ Finish time: ____________ °Date: ____________ Movements: ____________ Start time: ____________ Finish time: ____________  °Date: ____________ Movements: ____________ Start time: ____________ Finish time: ____________ °Date: ____________ Movements: ____________ Start time: ____________ Finish time: ____________ °Date: ____________ Movements: ____________ Start time: ____________ Finish time: ____________ °Date: ____________ Movements: ____________ Start time: ____________ Finish time: ____________ °Date: ____________ Movements: ____________ Start time: ____________ Finish time: ____________ °Date: ____________ Movements: ____________ Start time: ____________ Finish time: ____________ °Date: ____________ Movements: ____________ Start time: ____________ Finish time: ____________  °Date: ____________ Movements: ____________ Start time: ____________ Finish time: ____________ °Date: ____________ Movements: ____________ Start time: ____________ Finish time: ____________ °Date: ____________ Movements: ____________ Start time: ____________ Finish time: ____________ °Date: ____________ Movements: ____________ Start time: ____________ Finish time: ____________ °Date: ____________ Movements: ____________ Start time: ____________ Finish time: ____________ °Date: ____________ Movements: ____________ Start time: ____________ Finish time: ____________ °Date: ____________ Movements: ____________ Start time: ____________ Finish time: ____________  °Date:  ____________ Movements: ____________ Start time: ____________ Finish time: ____________ °Date: ____________ Movements: ____________ Start time: ____________ Finish time: ____________ °Date: ____________ Movements: ____________ Start time: ____________ Finish time: ____________ °Date: ____________ Movements: ____________ Start time: ____________ Finish time: ____________ °Date: ____________ Movements: ____________ Start time: ____________ Finish time: ____________ °Date: ____________ Movements: ____________ Start time: ____________ Finish time: ____________ °Date: ____________ Movements: ____________ Start time: ____________ Finish time: ____________  °Date: ____________ Movements: ____________ Start time: ____________ Finish time: ____________ °Date: ____________ Movements: ____________ Start time: ____________ Finish time: ____________ °Date: ____________ Movements: ____________ Start time: ____________ Finish time: ____________ °Date: ____________ Movements: ____________ Start time: ____________ Finish time: ____________ °Date: ____________ Movements: ____________ Start time: ____________ Finish time: ____________ °Date: ____________ Movements: ____________ Start time: ____________ Finish time: ____________ °  °This information is not intended to replace advice given to you by your health care provider. Make sure you discuss any questions you have with your health care provider. °  °Document Released: 09/14/2006 Document Revised: 09/05/2014 Document Reviewed: 06/11/2012 °[employer] Interactive Patient Education ©2016 [employer] °Braxton Hicks Contractions °Contractions of the uterus can occur throughout pregnancy. Contractions are not always a sign that you are in labor.  °WHAT ARE BRAXTON HICKS CONTRACTIONS?  °Contractions that occur before labor are called Braxton Hicks contractions, or false labor. Toward the end of pregnancy (32-34 weeks), these contractions can develop more often and may become more  forceful. This is not true labor because these contractions do not result in opening (dilatation) and thinning of the cervix. They are sometimes difficult to tell apart from true labor because these contractions can be forceful and people have different pain tolerances. You should   not feel embarrassed if you go to the hospital with false labor. Sometimes, the only way to tell if you are in true labor is for your health care provider to look for changes in the cervix. °If there are no prenatal problems or other health problems associated with the pregnancy, it is completely safe to be sent home with false labor and await the onset of true labor. °HOW CAN YOU TELL THE DIFFERENCE BETWEEN TRUE AND FALSE LABOR? °False Labor °· The contractions of false labor are usually shorter and not as hard as those of true labor.   °· The contractions are usually irregular.   °· The contractions are often felt in the front of the lower abdomen and in the groin.   °· The contractions may go away when you walk around or change positions while lying down.   °· The contractions get weaker and are shorter lasting as time goes on.   °· The contractions do not usually become progressively stronger, regular, and closer together as with true labor.   °True Labor °· Contractions in true labor last 30-70 seconds, become very regular, usually become more intense, and increase in frequency.   °· The contractions do not go away with walking.   °· The discomfort is usually felt in the top of the uterus and spreads to the lower abdomen and low back.   °· True labor can be determined by your health care provider with an exam. This will show that the cervix is dilating and getting thinner.   °WHAT TO REMEMBER °· Keep up with your usual exercises and follow other instructions given by your health care provider.   °· Take medicines as directed by your health care provider.   °· Keep your regular prenatal appointments.   °· Eat and drink lightly if you  think you are going into labor.   °· If Braxton Hicks contractions are making you uncomfortable:   °· Change your position from lying down or resting to walking, or from walking to resting.   °· Sit and rest in a tub of warm water.   °· Drink 2-3 glasses of water. Dehydration may cause these contractions.   °· Do slow and deep breathing several times an hour.   °WHEN SHOULD I SEEK IMMEDIATE MEDICAL CARE? °Seek immediate medical care if: °· Your contractions become stronger, more regular, and closer together.   °· You have fluid leaking or gushing from your vagina.   °· You have a fever.   °· You pass blood-tinged mucus.   °· You have vaginal bleeding.   °· You have continuous abdominal pain.   °· You have low back pain that you never had before.   °· You feel your baby's head pushing down and causing pelvic pressure.   °· Your baby is not moving as much as it used to.   °  °This information is not intended to replace advice given to you by your health care provider. Make sure you discuss any questions you have with your health care provider. °  °Document Released: 08/15/2005 Document Revised: 08/20/2013 Document Reviewed: 05/27/2013 °Elsevier Interactive Patient Education ©2016 [employer] ° °

## 2015-10-10 NOTE — Progress Notes (Signed)
Notified of pt arrival in MAU and exam. Will discharge home and offer ambien before discharge.

## 2015-10-11 ENCOUNTER — Encounter (HOSPITAL_COMMUNITY): Payer: Self-pay | Admitting: Anesthesiology

## 2015-10-11 ENCOUNTER — Inpatient Hospital Stay (HOSPITAL_COMMUNITY): Payer: Medicaid Other | Admitting: Anesthesiology

## 2015-10-11 ENCOUNTER — Inpatient Hospital Stay (HOSPITAL_COMMUNITY)
Admission: AD | Admit: 2015-10-11 | Discharge: 2015-10-13 | DRG: 774 | Disposition: A | Discharge status: Home / Self Care | Payer: Medicaid Other | Source: Ambulatory Visit | Attending: Obstetrics & Gynecology | Admitting: Obstetrics & Gynecology

## 2015-10-11 DIAGNOSIS — Z3403 Encounter for supervision of normal first pregnancy, third trimester: Secondary | ICD-10-CM

## 2015-10-11 DIAGNOSIS — L509 Urticaria, unspecified: Secondary | ICD-10-CM | POA: Diagnosis present

## 2015-10-11 DIAGNOSIS — O4292 Full-term premature rupture of membranes, unspecified as to length of time between rupture and onset of labor: Secondary | ICD-10-CM | POA: Diagnosis present

## 2015-10-11 DIAGNOSIS — IMO0001 Reserved for inherently not codable concepts without codable children: Secondary | ICD-10-CM

## 2015-10-11 DIAGNOSIS — O99824 Streptococcus B carrier state complicating childbirth: Secondary | ICD-10-CM | POA: Diagnosis present

## 2015-10-11 DIAGNOSIS — Z3A39 39 weeks gestation of pregnancy: Secondary | ICD-10-CM | POA: Diagnosis not present

## 2015-10-11 DIAGNOSIS — Z8249 Family history of ischemic heart disease and other diseases of the circulatory system: Secondary | ICD-10-CM

## 2015-10-11 LAB — CORD BLOOD GAS (ARTERIAL)

## 2015-10-11 LAB — TYPE AND SCREEN
ABO/RH(D): A POS
ANTIBODY SCREEN: NEGATIVE

## 2015-10-11 LAB — CBC
HEMATOCRIT: 34.2 % — AB (ref 36.0–46.0)
Hemoglobin: 12 g/dL (ref 12.0–15.0)
MCH: 31.3 pg (ref 26.0–34.0)
MCHC: 35.1 g/dL (ref 30.0–36.0)
MCV: 89.1 fL (ref 78.0–100.0)
PLATELETS: 195 10*3/uL (ref 150–400)
RBC: 3.84 MIL/uL — ABNORMAL LOW (ref 3.87–5.11)
RDW: 13.6 % (ref 11.5–15.5)
WBC: 11.2 10*3/uL — AB (ref 4.0–10.5)

## 2015-10-11 LAB — ABO/RH: ABO/RH(D): A POS

## 2015-10-11 MED ORDER — ONDANSETRON HCL 4 MG PO TABS: 4.0000 mg | ORAL_TABLET | ORAL | Status: DC | PRN

## 2015-10-11 MED ORDER — ACETAMINOPHEN 325 MG PO TABS: 650.0000 mg | ORAL_TABLET | ORAL | Status: DC | PRN

## 2015-10-11 MED ORDER — DIPHENHYDRAMINE HCL 50 MG/ML IJ SOLN: 12.5000 mg | INTRAMUSCULAR | Status: DC | PRN

## 2015-10-11 MED ORDER — GENTAMICIN SULFATE 40 MG/ML IJ SOLN
150.0000 mg | Freq: Three times a day (TID) | INTRAVENOUS | Status: DC
  Filled 2015-10-11 (×3): qty 3.75

## 2015-10-11 MED ORDER — SODIUM CHLORIDE 0.9 % IV SOLN
1.0000 g | Freq: Four times a day (QID) | INTRAVENOUS | Status: DC
  Filled 2015-10-11 (×3): qty 1000

## 2015-10-11 MED ORDER — EPHEDRINE 5 MG/ML INJ
10.0000 mg | INTRAVENOUS | Status: DC | PRN
  Filled 2015-10-11: qty 2

## 2015-10-11 MED ORDER — DIBUCAINE 1 % RE OINT
1.0000 "application " | TOPICAL_OINTMENT | RECTAL | Status: DC | PRN
  Filled 2015-10-11: qty 28

## 2015-10-11 MED ORDER — SENNOSIDES-DOCUSATE SODIUM 8.6-50 MG PO TABS
2.0000 | ORAL_TABLET | ORAL | Status: DC
  Administered 2015-10-12: 2 via ORAL
  Filled 2015-10-11: qty 2

## 2015-10-11 MED ORDER — OXYCODONE-ACETAMINOPHEN 5-325 MG PO TABS
1.0000 | ORAL_TABLET | ORAL | Status: DC | PRN
  Administered 2015-10-11: 1 via ORAL
  Filled 2015-10-11: qty 1

## 2015-10-11 MED ORDER — LIDOCAINE HCL (PF) 1 % IJ SOLN
INTRAMUSCULAR | Status: DC | PRN
  Administered 2015-10-11: 5 mL via EPIDURAL
  Administered 2015-10-11: 8 mL via EPIDURAL

## 2015-10-11 MED ORDER — TETANUS-DIPHTH-ACELL PERTUSSIS 5-2.5-18.5 LF-MCG/0.5 IM SUSP: 0.5000 mL | Freq: Once | INTRAMUSCULAR | Status: DC

## 2015-10-11 MED ORDER — LACTATED RINGERS IV SOLN
500.0000 mL | INTRAVENOUS | Status: DC | PRN
  Administered 2015-10-11: 1000 mL via INTRAVENOUS

## 2015-10-11 MED ORDER — EPHEDRINE 5 MG/ML INJ
10.0000 mg | INTRAVENOUS | Status: DC | PRN
Start: 2015-10-11 — End: 2015-10-11
  Filled 2015-10-11: qty 2

## 2015-10-11 MED ORDER — OXYCODONE-ACETAMINOPHEN 5-325 MG PO TABS: 2.0000 | ORAL_TABLET | ORAL | Status: DC | PRN

## 2015-10-11 MED ORDER — OXYTOCIN BOLUS FROM INFUSION
500.0000 mL | INTRAVENOUS | Status: DC
  Administered 2015-10-11: 500 mL via INTRAVENOUS

## 2015-10-11 MED ORDER — ONDANSETRON HCL 4 MG/2ML IJ SOLN: 4.0000 mg | Freq: Four times a day (QID) | INTRAMUSCULAR | Status: DC | PRN

## 2015-10-11 MED ORDER — ONDANSETRON HCL 4 MG/2ML IJ SOLN: 4.0000 mg | INTRAMUSCULAR | Status: DC | PRN

## 2015-10-11 MED ORDER — DIPHENHYDRAMINE HCL 25 MG PO CAPS: 25.0000 mg | ORAL_CAPSULE | Freq: Four times a day (QID) | ORAL | Status: DC | PRN

## 2015-10-11 MED ORDER — SIMETHICONE 80 MG PO CHEW: 80.0000 mg | CHEWABLE_TABLET | ORAL | Status: DC | PRN

## 2015-10-11 MED ORDER — PENICILLIN G POTASSIUM 5000000 UNITS IJ SOLR
2.5000 10*6.[IU] | INTRAVENOUS | Status: DC
  Filled 2015-10-11 (×3): qty 2.5

## 2015-10-11 MED ORDER — OXYCODONE-ACETAMINOPHEN 5-325 MG PO TABS: 1.0000 | ORAL_TABLET | ORAL | Status: DC | PRN

## 2015-10-11 MED ORDER — ZOLPIDEM TARTRATE 5 MG PO TABS: 5.0000 mg | ORAL_TABLET | Freq: Every evening | ORAL | Status: DC | PRN

## 2015-10-11 MED ORDER — TERBUTALINE SULFATE 1 MG/ML IJ SOLN
0.2500 mg | Freq: Once | INTRAMUSCULAR | Status: DC | PRN
  Filled 2015-10-11: qty 1

## 2015-10-11 MED ORDER — OXYTOCIN 10 UNIT/ML IJ SOLN
1.0000 m[IU]/min | INTRAMUSCULAR | Status: DC
  Administered 2015-10-11: 2 m[IU]/min via INTRAVENOUS

## 2015-10-11 MED ORDER — WITCH HAZEL-GLYCERIN EX PADS: 1.0000 "application " | MEDICATED_PAD | CUTANEOUS | Status: DC | PRN

## 2015-10-11 MED ORDER — PHENYLEPHRINE 40 MCG/ML (10ML) SYRINGE FOR IV PUSH (FOR BLOOD PRESSURE SUPPORT)
80.0000 ug | PREFILLED_SYRINGE | INTRAVENOUS | Status: DC | PRN
  Filled 2015-10-11: qty 2

## 2015-10-11 MED ORDER — CITRIC ACID-SODIUM CITRATE 334-500 MG/5ML PO SOLN: 30.0000 mL | ORAL | Status: DC | PRN

## 2015-10-11 MED ORDER — PHENYLEPHRINE 40 MCG/ML (10ML) SYRINGE FOR IV PUSH (FOR BLOOD PRESSURE SUPPORT)
80.0000 ug | PREFILLED_SYRINGE | INTRAVENOUS | Status: DC | PRN
  Filled 2015-10-11: qty 20
  Filled 2015-10-11: qty 2

## 2015-10-11 MED ORDER — DOCUSATE SODIUM 100 MG PO CAPS
100.0000 mg | ORAL_CAPSULE | Freq: Two times a day (BID) | ORAL | Status: DC
  Administered 2015-10-12 – 2015-10-13 (×2): 100 mg via ORAL
  Filled 2015-10-11 (×2): qty 1

## 2015-10-11 MED ORDER — LIDOCAINE HCL (PF) 1 % IJ SOLN
30.0000 mL | INTRAMUSCULAR | Status: DC | PRN
  Filled 2015-10-11: qty 30

## 2015-10-11 MED ORDER — DIPHENHYDRAMINE HCL 25 MG PO CAPS
25.0000 mg | ORAL_CAPSULE | Freq: Once | ORAL | Status: AC
  Administered 2015-10-11: 25 mg via ORAL
  Filled 2015-10-11: qty 1

## 2015-10-11 MED ORDER — BENZOCAINE-MENTHOL 20-0.5 % EX AERO
1.0000 "application " | INHALATION_SPRAY | CUTANEOUS | Status: DC | PRN
  Administered 2015-10-12: 1 via TOPICAL

## 2015-10-11 MED ORDER — FENTANYL 2.5 MCG/ML BUPIVACAINE 1/10 % EPIDURAL INFUSION (WH - ANES)
14.0000 mL/h | INTRAMUSCULAR | Status: DC | PRN
Start: 2015-10-11 — End: 2015-10-11
  Administered 2015-10-11: 14 mL/h via EPIDURAL
  Filled 2015-10-11: qty 125

## 2015-10-11 MED ORDER — LACTATED RINGERS IV SOLN
INTRAVENOUS | Status: DC
  Administered 2015-10-11 (×2): via INTRAVENOUS

## 2015-10-11 MED ORDER — OXYTOCIN 10 UNIT/ML IJ SOLN
2.5000 [IU]/h | INTRAMUSCULAR | Status: DC
  Administered 2015-10-11: 2.5 [IU]/h via INTRAVENOUS
  Filled 2015-10-11: qty 4

## 2015-10-11 MED ORDER — IBUPROFEN 600 MG PO TABS
600.0000 mg | ORAL_TABLET | Freq: Four times a day (QID) | ORAL | Status: DC
  Administered 2015-10-12 – 2015-10-13 (×7): 600 mg via ORAL
  Filled 2015-10-11 (×6): qty 1

## 2015-10-11 MED ORDER — LANOLIN HYDROUS EX OINT: TOPICAL_OINTMENT | CUTANEOUS | Status: DC | PRN

## 2015-10-11 MED ORDER — SODIUM CHLORIDE 0.9 % IV SOLN
2.0000 g | Freq: Once | INTRAVENOUS | Status: AC
  Administered 2015-10-11: 2 g via INTRAVENOUS
  Filled 2015-10-11: qty 2000

## 2015-10-11 MED ORDER — PRENATAL MULTIVITAMIN CH
1.0000 | ORAL_TABLET | Freq: Every day | ORAL | Status: DC
  Administered 2015-10-12 – 2015-10-13 (×2): 1 via ORAL
  Filled 2015-10-11 (×2): qty 1

## 2015-10-11 MED ORDER — LACTATED RINGERS IV SOLN: 500.0000 mL | Freq: Once | INTRAVENOUS | Status: DC

## 2015-10-11 MED ORDER — PENICILLIN G POTASSIUM 5000000 UNITS IJ SOLR
5.0000 10*6.[IU] | Freq: Once | INTRAVENOUS | Status: DC
  Filled 2015-10-11: qty 5

## 2015-10-11 NOTE — Anesthesia Procedure Notes (Signed)
Epidural Patient location during procedure: OB Start time: 10/11/2015 3:55 PM End time: 10/11/2015 3:59 PM  Staffing Anesthesiologist: Leilani Able Performed by: anesthesiologist   Preanesthetic Checklist Completed: patient identified, surgical consent, pre-op evaluation, timeout performed, IV checked, risks and benefits discussed and monitors and equipment checked  Epidural Patient position: sitting Prep: site prepped and draped and DuraPrep Patient monitoring: continuous pulse ox and blood pressure Approach: midline Location: L3-L4 Injection technique: LOR air  Needle:  Needle type: Tuohy  Needle gauge: 17 G Needle length: 9 cm and 9 Needle insertion depth: 6 cm Catheter type: closed end flexible Catheter size: 19 Gauge Catheter at skin depth: 11 cm Test dose: negative and Other  Assessment Sensory level: T9 Events: blood not aspirated, injection not painful, no injection resistance, negative IV test and no paresthesia  Additional Notes Reason for block:procedure for pain

## 2015-10-11 NOTE — Progress Notes (Signed)
Labor Progress Note  Sandy Peters is a 25 y.o. G1P0 at [redacted]w[redacted]d  admitted for SOL and SROM  S:  Introduced self. Is pushing with nursing.   O:  BP 120/72 mmHg  Pulse 144  Temp(Src) 99 F (37.2 C) (Oral)  Resp 18  Ht  (1.651 m)  Wt 79.379 kg (175 lb)  BMI 29.12 kg/m2  SpO2 100%  LMP 01/11/2015 (Exact Date)     FHT:  FHR: 175-180 bpm, variability: moderate,  accelerations:  Abscent,  decelerations:  Present Variables with contractions UC:   Every 1.5 -3 mins  SVE:   Dilation: 10 Effacement (%): 100 Station: +2 Exam by:: Darlene, CNM SROM clear   Labs: Lab Results  Component Value Date   WBC 11.2* 10/11/2015   HGB 12.0 10/11/2015   HCT 34.2* 10/11/2015   MCV 89.1 10/11/2015   PLT 195 10/11/2015    Assessment / Plan: 25 y.o. G1P0 [redacted]w[redacted]d active labor. Pushing with nursing   Labor: currently pushing with nursing Fetal Wellbeing:  Category II; some tachycardia and variables with contractions  Pain Control:  Epidural Anticipated MOD:  NSVD   Palma Holter, MD PGY 1 Family Medicine

## 2015-10-11 NOTE — Anesthesia Preprocedure Evaluation (Signed)
Anesthesia Evaluation  Patient identified by MRN, date of birth, ID band Patient awake    Reviewed: Allergy & Precautions, H&P , NPO status , Patient's Chart, lab work & pertinent test results  Airway Mallampati: I  TM Distance: >3 FB Neck ROM: full    Dental no notable dental hx.    Pulmonary neg pulmonary ROS,    Pulmonary exam normal breath sounds clear to auscultation       Cardiovascular negative cardio ROS Normal cardiovascular exam     Neuro/Psych negative neurological ROS  negative psych ROS   GI/Hepatic negative GI ROS, Neg liver ROS,   Endo/Other  negative endocrine ROS  Renal/GU negative Renal ROS     Musculoskeletal   Abdominal Normal abdominal exam  (+)   Peds  Hematology negative hematology ROS (+)   Anesthesia Other Findings   Reproductive/Obstetrics (+) Pregnancy                             Anesthesia Physical Anesthesia Plan  ASA: II  Anesthesia Plan: Epidural   Post-op Pain Management:    Induction:   Airway Management Planned:   Additional Equipment:   Intra-op Plan:   Post-operative Plan:   Informed Consent: I have reviewed the patients History and Physical, chart, labs and discussed the procedure including the risks, benefits and alternatives for the proposed anesthesia with the patient or authorized representative who has indicated his/her understanding and acceptance.     Plan Discussed with:   Anesthesia Plan Comments:         Anesthesia Quick Evaluation  

## 2015-10-11 NOTE — Progress Notes (Signed)
Patient developed rash after being ampillicin.  Amp stopped.

## 2015-10-11 NOTE — H&P (Signed)
Sandy Peters is a 25 y.o. femaleG 1 @ 39 wks  presenting for SROM at 1230 and active labor. GBS pos Maternal Medical History:  Reason for admission: Rupture of membranes.   Contractions: Onset was 3-5 hours ago.   Frequency: regular.   Perceived severity is moderate.    Fetal activity: Perceived fetal activity is normal.   Last perceived fetal movement was within the past hour.    Prenatal complications: no prenatal complications Prenatal Complications - Diabetes: none.    OB History    Gravida Para Term Preterm AB TAB SAB Ectopic Multiple Living   1              Past Medical History  Diagnosis Date  . Pregnant 03/03/2015  . Medical history non-contributory    Past Surgical History  Procedure Laterality Date  . No past surgeries     Family History: family history includes Anemia in her maternal grandmother; Cancer in her maternal grandfather; Hypertension in her maternal grandmother and mother; Thyroid disease in her maternal grandmother. Social History:  reports that she has never smoked. She has never used smokeless tobacco. She reports that she does not drink alcohol or use illicit drugs.   Prenatal Transfer Tool  Maternal Diabetes: No Genetic Screening: Normal Maternal Ultrasounds/Referrals: Normal Fetal Ultrasounds or other Referrals:  None Maternal Substance Abuse:  No Significant Maternal Medications:  None Significant Maternal Lab Results:  Lab values include: Group B Strep positive Other Comments:  None  Review of Systems  Constitutional: Negative.   HENT: Negative.   Eyes: Negative.   Respiratory: Negative.   Cardiovascular: Negative.   Gastrointestinal: Positive for abdominal pain.  Genitourinary: Negative.   Musculoskeletal: Negative.   Skin: Negative.   Neurological: Negative.   Endo/Heme/Allergies: Negative.   Psychiatric/Behavioral: Negative.     Dilation: 5 Effacement (%): 90 Station: -1 Exam by:: S. Carrera, RNC Blood pressure 138/82,  pulse 95, temperature 97.7 F (36.5 C), temperature source Oral, resp. rate 18, last menstrual period 01/11/2015. Maternal Exam:  Uterine Assessment: Contraction strength is moderate.  Contraction frequency is regular.   Abdomen: Patient reports no abdominal tenderness. Fetal presentation: vertex  Introitus: Normal vulva. Normal vagina.  Ferning test: positive.  Amniotic fluid character: clear.  Pelvis: adequate for delivery.   Cervix: Cervix evaluated by digital exam.     Fetal Exam Fetal Monitor Review: Mode: ultrasound.   Variability: moderate (6-25 bpm).    Fetal State Assessment: Category I - tracings are normal.     Physical Exam  Constitutional: She is oriented to person, place, and time. She appears well-developed and well-nourished.  HENT:  Head: Normocephalic.  Eyes: Pupils are equal, round, and reactive to light.  Neck: Normal range of motion.  Cardiovascular: Normal rate, regular rhythm, normal heart sounds and intact distal pulses.   Respiratory: Effort normal and breath sounds normal.  GI: Soft. Bowel sounds are normal.  Genitourinary: Vagina normal and uterus normal.  Musculoskeletal: Normal range of motion.  Neurological: She is alert and oriented to person, place, and time. She has normal reflexes.  Skin: Skin is warm and dry.  Psychiatric: She has a normal mood and affect. Her behavior is normal. Judgment and thought content normal.    Prenatal labs: ABO, Rh: A/Positive/-- (07/25 1001) Antibody: Negative (11/22 0825) Rubella: 3.98 (07/25 1001) RPR: Non Reactive (11/22 0825)  HBsAg: Negative (07/25 1001)  HIV: Non Reactive (11/22 0825)  GBS:     Assessment/Plan: SVE 9/100/+1 gross rupt noted . Will  admit and start amp   Soren Pigman DARLENE 10/11/2015, 3:42 PM

## 2015-10-11 NOTE — Progress Notes (Signed)
ANTIBIOTIC CONSULT NOTE - INITIAL  Pharmacy Consult for Gentamicin Indication:  chorioamnionitis or triple I  No Known Allergies  Patient Measurements: Height:  (165.1 cm) Weight: 175 lb (79.379 kg) IBW/kg (Calculated) : 57 Adjusted Body Weight: 63.7kg  Vital Signs: Temp: 100.6 F (38.1 C) (02/12 2053) Temp Source: Axillary (02/12 2053) BP: 134/102 mmHg (02/12 2104) Pulse Rate: 130 (02/12 2105)  Labs:  Recent Labs  10/11/15 1505  WBC 11.2*  HGB 12.0  PLT 195   No results for input(s): GENTTROUGH, GENTPEAK, GENTRANDOM in the last 72 hours.   Microbiology: Recent Results (from the past 720 hour(s))  GC/Chlamydia Probe Amp     Status: None   Collection Time: 09/29/15 11:05 AM  Result Value Ref Range Status   Chlamydia trachomatis, NAA Negative Negative Final   Neisseria gonorrhoeae by PCR Negative Negative Final    Medications:  Ampicillin 1gm IV q6hrs  Assessment: 25 y.o. female G1P0 at [redacted]w[redacted]d on ampicillin and gentamicin for fever/triple I.  Estimated Ke = 0.336, Vd = 0.4L/kg  Goal of Therapy:  Gentamicin peak 6-8 mg/L and Trough < 1 mg/L  Plan:  Gentamicin 150 mg IV every 8 hrs  Check Scr with next labs if gentamicin continued. Will check gentamicin levels if continued > 72hr or clinically indicated.  Wendie Simmer, PharmD, BCPS Clinical Pharmacist  Pager: [phone]  10/11/2015,9:25 PM

## 2015-10-11 NOTE — MAU Note (Signed)
States water broke around 1230 and has been having contractions for 4 days.

## 2015-10-11 NOTE — Progress Notes (Signed)
Interim Note:  Nursing informed patient developed a rash on her left forearm after starting second dose of Ampicillin. Went to evaluate patient. Patient denies difficulty breathing or itching. Per patient, no history of allergies to antibiotics. Reports she has not taken Amoxicillin/Augmentin in the past. On exam, multiple small urticaria noted ventral side of the left forearm without erythema or tenderness.   Discussed with Ms. Zerita Boers. Will continue antibiotic for GBS prophylaxis. Benadryl 25 mg PO once.   Palma Holter PGY 1 Family Medicine

## 2015-10-12 ENCOUNTER — Encounter (HOSPITAL_COMMUNITY): Payer: Self-pay | Admitting: *Deleted

## 2015-10-12 LAB — CBC
HCT: 28.5 % — ABNORMAL LOW (ref 36.0–46.0)
HEMOGLOBIN: 9.7 g/dL — AB (ref 12.0–15.0)
MCH: 30.5 pg (ref 26.0–34.0)
MCHC: 34 g/dL (ref 30.0–36.0)
MCV: 89.6 fL (ref 78.0–100.0)
Platelets: 163 10*3/uL (ref 150–400)
RBC: 3.18 MIL/uL — AB (ref 3.87–5.11)
RDW: 13.8 % (ref 11.5–15.5)
WBC: 15.7 10*3/uL — ABNORMAL HIGH (ref 4.0–10.5)

## 2015-10-12 LAB — HIV ANTIBODY (ROUTINE TESTING W REFLEX): HIV Screen 4th Generation wRfx: NONREACTIVE

## 2015-10-12 LAB — RPR: RPR Ser Ql: NONREACTIVE

## 2015-10-12 NOTE — Lactation Note (Signed)
This note was copied from a baby's chart. Lactation Consultation Note New mom is breast and bottle feeding. Has attempted to BF, baby not really interested in BF at this time. Mec. Stain fluid, increased respirations and grunting after birth. This has resolved. Mom asking for bottle since she is breast/formula feeding. Mom states BF is hard. I explained the baby isn't hungry at this time, he will get that way probably in the next 12 hours or so, he has been through a lot and needs to adjust. Baby will suckle on finger but will not suckle on breast or bottle. Attempted to give formula, baby mostly chewed, and spit formula out. Sucked a few times. Est. 2ml was being generous. Attempted suck training by stroking tongue downward, d/t humping in the back, but baby will cup tongue around finger after stimulated and suckle, and switch to a bottle quickly will stop. Noticed a recessed chin and under bite. Instructed to gently pull chin down after baby latches or puts bottle in mouth. Bottle lip even turns inwards on bottle.  Hand expression taught to mom w/good colostrum noted. Mom has long pendulum breast w/small nipples, everts out w/stimulation and very compressible areolas. Referred to Baby and Me Book in Breastfeeding section Pg. 22-23 for position options and Proper latch demonstration. Mom encouraged to feed baby 8-12 times/24 hours and with feeding cues. Educated about newborn behavior, mom doing STS well and the importance, I&O, supply and demand, engorgement from not BF, risk of decreased milk supply. I question the commitment of BF. Mom has WIC. WH/LC brochure given w/resources, support groups and LC services. Patient Name: Sandy Peters [id number] Date: 10/12/2015 Reason for consult: Initial assessment   Maternal Data Has patient been taught Hand Expression?: Yes Does the patient have breastfeeding experience prior to this delivery?: No  Feeding Feeding Type: Formula Nipple Type: Slow -  flow Length of feed: 0 min  LATCH Score/Interventions Latch: Too sleepy or reluctant, no latch achieved, no sucking elicited. Intervention(s): Skin to skin;Teach feeding cues;Waking techniques  Audible Swallowing: None Intervention(s): Skin to skin;Hand expression  Type of Nipple: Everted at rest and after stimulation  Comfort (Breast/Nipple): Soft / non-tender     Hold (Positioning): Assistance needed to correctly position infant at breast and maintain latch. Intervention(s): Skin to skin;Position options;Support Pillows;Breastfeeding basics reviewed  LATCH Score: 5  Lactation Tools Discussed/Used Tools: Bottle WIC Program: Yes   Consult Status Consult Status: Follow-up Date: 10/12/15 (in pm) Follow-up type: In-patient    Charyl Dancer 10/12/2015, 2:59 AM

## 2015-10-12 NOTE — Anesthesia Postprocedure Evaluation (Addendum)
Anesthesia Post Note  Patient: Sandy Peters  Procedure(s) Performed: * No procedures listed *  Patient location during evaluation: Mother Baby Anesthesia Type: Epidural Level of consciousness: awake Pain management: satisfactory to patient Vital Signs Assessment: post-procedure vital signs reviewed and stable Respiratory status: spontaneous breathing Cardiovascular status: stable Anesthetic complications: no Comments: Current pain 2    Last Vitals:  Filed Vitals:   10/12/15 0030 10/12/15 0430  BP: 122/76 117/73  Pulse: 100 98  Temp: 36.8 C 36.9 C  Resp: 18 18    Last Pain:  Filed Vitals:   10/12/15 0614  PainSc: 0-No pain                 Hilja Kintzel

## 2015-10-12 NOTE — Progress Notes (Signed)
UR chart review completed.  

## 2015-10-12 NOTE — Progress Notes (Signed)
Post Partum Day 1  Subjective:  Sandy Peters is a 25 y.o. G1P1001 [redacted]w[redacted]d s/p VAVD for recurrent variables.  No acute events overnight.  Pt denies problems with ambulating, voiding or po intake.  She denies nausea or vomiting.  Pain is well controlled.  She has had flatus. She has not had bowel movement.  Lochia Small.  Plan for birth control is Mirena.  Method of Feeding: breast and bottle  Objective: BP 117/73 mmHg  Pulse 98  Temp(Src) 98.4 F (36.9 C) (Oral)  Resp 18  Ht  (1.651 m)  Wt 79.379 kg (175 lb)  BMI 29.12 kg/m2  SpO2 98%  LMP 01/11/2015 (Exact Date)  Breastfeeding? Unknown  Physical Exam:  General: alert, cooperative and no distress Lochia:normal flow Chest: CTAB Heart: RRR no m/r/g Abdomen: +BS, soft, nontender, fundus firm at/below umbilicus Uterine Fundus: firm, below umbilicus DVT Evaluation: No evidence of DVT seen on physical exam. Extremities: no edema   Recent Labs  10/11/15 1505 10/12/15 0538  HGB 12.0 9.7*  HCT 34.2* 28.5*    Assessment/Plan:  ASSESSMENT: Sandy Peters is a 25 y.o. G1P1001 [redacted]w[redacted]d ppd #1 s/pVAVD doing well.   Plan for discharge tomorrow   LOS: 1 day   Palma Holter 10/12/2015, 9:11 AM

## 2015-10-13 ENCOUNTER — Encounter: Payer: Medicaid Other | Admitting: Advanced Practice Midwife

## 2015-10-13 MED ORDER — SENNOSIDES-DOCUSATE SODIUM 8.6-50 MG PO TABS: 1.0000 | ORAL_TABLET | Freq: Every day | ORAL | Status: DC

## 2015-10-13 MED ORDER — IBUPROFEN 600 MG PO TABS: 600.0000 mg | ORAL_TABLET | Freq: Four times a day (QID) | ORAL | Status: DC | PRN

## 2015-10-13 NOTE — Discharge Summary (Signed)
OB Discharge Summary     Patient Name: Sandy Peters DOB: 16-Jan-1991 MRN: [id number]  Date of admission: 10/11/2015 Delivering MD: Wyvonnia Dusky D   Date of discharge: 10/13/2015  Admitting diagnosis: 39 WKS, WATER BROKE, CTXS Intrauterine pregnancy: [redacted]w[redacted]d     Secondary diagnosis:  Active Problems:   Active labor at term  Additional problems: none     Discharge diagnosis: Term Pregnancy Delivered                                                                                                Post partum procedures:none  Augmentation: Pitocin  Complications: Fever of 100.6 during labor giving patient diagnosis of Triple I. Patient received Ampicillin, but did not receive Gentamicin due to inadequate time to administer prior to delivery. Patient was afebrile postpartum.   Hospital course:  Onset of Labor With Vaginal Delivery     25 y.o. yo G1P1001 at [redacted]w[redacted]d was admitted in Active Labor on 10/11/2015. Patient required Vacuum assisted vaginal delivery due to recurrent variables and inadequate pushing. As noted above, patient had fever during labor.  Membrane Rupture Time/Date: 12:30 PM ,10/11/2015   Intrapartum Procedures: Episiotomy: Median [2]                                         Lacerations:  2nd degree [3]  Patient had a delivery of a Viable infant. 10/11/2015  Information for the patient's newborn:  Elizabeht, Suto [[id number]]  Delivery Method: Vag-Spont     Pateint had an uncomplicated postpartum course.  She is ambulating, tolerating a regular diet, passing flatus, and urinating well. Patient is discharged home in stable condition on 10/13/2015.    Physical exam  Filed Vitals:   10/12/15 0430 10/12/15 1200 10/12/15 1746 10/13/15 0558  BP: 117/73 115/70 114/71 123/75  Pulse: 98 90 86 110  Temp: 98.4 F (36.9 C) 98.2 F (36.8 C) 98.5 F (36.9 C) 97.5 F (36.4 C)  TempSrc: Oral Oral Oral Axillary  Resp: Height:      Weight:      SpO2: 98%       General: alert, cooperative and no distress Lochia: appropriate Uterine Fundus: firm Incision: N/A DVT Evaluation: No evidence of DVT seen on physical exam. No cords or calf tenderness. No significant calf/ankle edema. Labs: Lab Results  Component Value Date   WBC 15.7* 10/12/2015   HGB 9.7* 10/12/2015   HCT 28.5* 10/12/2015   MCV 89.6 10/12/2015   PLT 163 10/12/2015   No flowsheet data found.  Discharge instruction: per After Visit Summary and "Baby and Me Booklet".  After visit meds:    Medication List    STOP taking these medications        ferrous sulfate 325 (65 FE) MG tablet      TAKE these medications        ibuprofen 600 MG tablet  Commonly known as:  ADVIL,MOTRIN  Take 1 tablet (600 mg total) by mouth every  6 (six) hours as needed for mild pain, moderate pain or cramping.     prenatal vitamin w/FE, FA 27-1 MG Tabs tablet  Take 1 tablet by mouth daily at 12 noon.     senna-docusate 8.6-50 MG tablet  Commonly known as:  Senokot-S  Take 1 tablet by mouth at bedtime.        Diet: routine diet  Activity: Advance as tolerated. Pelvic rest for 6 weeks.   Outpatient follow up:6 weeks Follow up Appt: Future Appointments Date Time Provider Department Center  11/23/2015 9:00 AM Cheral Marker, CNM [facility]   Follow up Visit: Follow-up Information    Follow up with [facility].   Specialty:  Obstetrics and Gynecology   Why:  Please make a follow up apponitment with your OBGYN in 6 weeks (for postpartum visit) or sooner if you have concerns    Contact information:   [address] [place] [address] [phone]      Postpartum contraception: Mirena  Newborn Data: Live born female  Birth Weight: 6 lb 15.3 oz (3155 g) APGAR: 1, 7  Baby Feeding: Bottle and Breast Disposition:home with mother   10/13/2015 Palma Holter, MD  PGY 1 Family Medicine

## 2015-10-13 NOTE — Discharge Instructions (Signed)

## 2015-10-14 ENCOUNTER — Ambulatory Visit: Payer: Self-pay

## 2015-10-14 NOTE — Lactation Note (Signed)
This note was copied from a baby's chart. Lactation Consultation Note: Mom has been pumping and bottle feeding EBM and formula. Offered assist with latch and mom refused- states she just wants to pump and bottle. Has not pumped since 1 am and reports breasts are feeling heavier today. Encouraged to be consistent with pumping to prevent engorgement. Reviewed engorgement prevention and treatment. Plans to see [facility] tomorrow for a pump. No questions at present. Pumping as I left room.   Patient Name: Sandy Peters ZOXWR'U Date: 10/14/2015 Reason for consult: Follow-up assessment   Maternal Data Formula Feeding for Exclusion: Yes Reason for exclusion: Mother's choice to formula and breast feed on admission Has patient been taught Hand Expression?: Yes Does the patient have breastfeeding experience prior to this delivery?: No  Feeding    LATCH Score/Interventions                      Lactation Tools Discussed/Used WIC Program: Yes   Consult Status Consult Status: Complete    Pamelia Hoit 10/14/2015, 8:33 AM

## 2015-10-16 ENCOUNTER — Encounter: Payer: Medicaid Other | Admitting: Obstetrics & Gynecology

## 2015-11-23 ENCOUNTER — Ambulatory Visit: Payer: Medicaid Other | Admitting: Women's Health

## 2015-11-24 ENCOUNTER — Encounter: Payer: Self-pay | Admitting: Women's Health

## 2015-11-24 ENCOUNTER — Ambulatory Visit (INDEPENDENT_AMBULATORY_CARE_PROVIDER_SITE_OTHER): Payer: Medicaid Other | Admitting: Women's Health

## 2015-11-24 NOTE — Patient Instructions (Addendum)
NO SEX UNTIL AFTER YOU GET YOUR BIRTH CONTROL   Levonorgestrel intrauterine device (IUD) What is this medicine? LEVONORGESTREL IUD (LEE voe nor jes trel) is a contraceptive (birth control) device. The device is placed inside the uterus by a healthcare professional. It is used to prevent pregnancy and can also be used to treat heavy bleeding that occurs during your period. Depending on the device, it can be used for 3 to 5 years. This medicine may be used for other purposes; ask your health care provider or pharmacist if you have questions. What should I tell my health care provider before I take this medicine? They need to know if you have any of these conditions: -abnormal Pap smear -cancer of the breast, uterus, or cervix -diabetes -endometritis -genital or pelvic infection now or in the past -have more than one sexual partner or your partner has more than one partner -heart disease -history of an ectopic or tubal pregnancy -immune system problems -IUD in place -liver disease or tumor -problems with blood clots or take blood-thinners -use intravenous drugs -uterus of unusual shape -vaginal bleeding that has not been explained -an unusual or allergic reaction to levonorgestrel, other hormones, silicone, or polyethylene, medicines, foods, dyes, or preservatives -pregnant or trying to get pregnant -breast-feeding How should I use this medicine? This device is placed inside the uterus by a health care professional. Talk to your pediatrician regarding the use of this medicine in children. Special care may be needed. Overdosage: If you think you have taken too much of this medicine contact a poison control center or emergency room at once. NOTE: This medicine is only for you. Do not share this medicine with others. What if I miss a dose? This does not apply. What may interact with this medicine? Do not take this medicine with any of the following  medications: -amprenavir -bosentan -fosamprenavir This medicine may also interact with the following medications: -aprepitant -barbiturate medicines for inducing sleep or treating seizures -bexarotene -griseofulvin -medicines to treat seizures like carbamazepine, ethotoin, felbamate, oxcarbazepine, phenytoin, topiramate -modafinil -pioglitazone -rifabutin -rifampin -rifapentine -some medicines to treat HIV infection like atazanavir, indinavir, lopinavir, nelfinavir, tipranavir, ritonavir -St. John's wort -warfarin This list may not describe all possible interactions. Give your health care provider a list of all the medicines, herbs, non-prescription drugs, or dietary supplements you use. Also tell them if you smoke, drink alcohol, or use illegal drugs. Some items may interact with your medicine. What should I watch for while using this medicine? Visit your doctor or health care professional for regular check ups. See your doctor if you or your partner has sexual contact with others, becomes HIV positive, or gets a sexual transmitted disease. This product does not protect you against HIV infection (AIDS) or other sexually transmitted diseases. You can check the placement of the IUD yourself by reaching up to the top of your vagina with clean fingers to feel the threads. Do not pull on the threads. It is a good habit to check placement after each menstrual period. Call your doctor right away if you feel more of the IUD than just the threads or if you cannot feel the threads at all. The IUD may come out by itself. You may become pregnant if the device comes out. If you notice that the IUD has come out use a backup birth control method like condoms and call your health care provider. Using tampons will not change the position of the IUD and are okay to use during your   period. What side effects may I notice from receiving this medicine? Side effects that you should report to your doctor or  health care professional as soon as possible: -allergic reactions like skin rash, itching or hives, swelling of the face, lips, or tongue -fever, flu-like symptoms -genital sores -high blood pressure -no menstrual period for 6 weeks during use -pain, swelling, warmth in the leg -pelvic pain or tenderness -severe or sudden headache -signs of pregnancy -stomach cramping -sudden shortness of breath -trouble with balance, talking, or walking -unusual vaginal bleeding, discharge -yellowing of the eyes or skin Side effects that usually do not require medical attention (report to your doctor or health care professional if they continue or are bothersome): -acne -breast pain -change in sex drive or performance -changes in weight -cramping, dizziness, or faintness while the device is being inserted -headache -irregular menstrual bleeding within first 3 to 6 months of use -nausea This list may not describe all possible side effects. Call your doctor for medical advice about side effects. You may report side effects to FDA at 1-800-FDA-1088. Where should I keep my medicine? This does not apply. NOTE: This sheet is a summary. It may not cover all possible information. If you have questions about this medicine, talk to your doctor, pharmacist, or health care provider.    2016, Elsevier/Gold Standard. (2011-09-15 13:54:04)  

## 2015-11-24 NOTE — Progress Notes (Signed)
Patient ID: Sandy Peters, female   DOB: 09/04/1990, 25 y.o.   MRN: [id number] Subjective:    Sandy Peters is a 25 y.o. 301P1001 African American female who presents for a postpartum visit. She is 6 weeks postpartum following a vacuum, low at 39 gestational weeks. Anesthesia: epidural. I have fully reviewed the prenatal and intrapartum course. Postpartum course has been uncomplicated. Baby's course has been uncomplicated. Baby is feeding by breast x 3wks now bottle. Bleeding no bleeding. Bowel function is normal. Bladder function is normal. Patient is not sexually active. Last sexual activity: prior to birth of baby. Contraception method is abstinence and wants IUD. Postpartum depression screening: negative. Score 0.  Last pap 03/23/15 and was neg.  The following portions of the patient's history were reviewed and updated as appropriate: allergies, current medications, past medical history, past surgical history and problem list.  Review of Systems Pertinent items are noted in HPI.   Filed Vitals:   11/24/15 1533  BP: 104/68  Pulse: 78  Weight: 154 lb (69.854 kg)   No LMP recorded.  Objective:   General:  alert, cooperative and no distress   Breasts:  deferred, no complaints  Lungs: clear to auscultation bilaterally  Heart:  regular rate and rhythm  Abdomen: soft, nontender   Vulva: normal  Vagina: normal vagina, episiotomy well healed  Cervix:  closed  Corpus: Well-involuted  Adnexa:  Non-palpable  Rectal Exam: No hemorrhoids        Assessment:   Postpartum exam 6 wks s/p VAVB Bottlefeeding Depression screening Contraception counseling   Plan:   Contraception: abstinence until IUD Follow up in: this week for IUD insertion (boyfriend coming home next week- pt wants to have in by then)  Sandy DuncansBooker, Sandy Peters CNM, [facility]NP-BC 11/24/2015 3:48 PM

## 2015-11-26 ENCOUNTER — Ambulatory Visit (INDEPENDENT_AMBULATORY_CARE_PROVIDER_SITE_OTHER): Payer: Medicaid Other | Admitting: Advanced Practice Midwife

## 2015-11-26 ENCOUNTER — Encounter: Payer: Self-pay | Admitting: Advanced Practice Midwife

## 2015-11-26 VITALS — BP 108/72 | HR 80 | Ht 66.0 in | Wt 154.0 lb

## 2015-11-26 DIAGNOSIS — Z3202 Encounter for pregnancy test, result negative: Secondary | ICD-10-CM

## 2015-11-26 DIAGNOSIS — Z30014 Encounter for initial prescription of intrauterine contraceptive device: Secondary | ICD-10-CM

## 2015-11-26 DIAGNOSIS — Z30430 Encounter for insertion of intrauterine contraceptive device: Secondary | ICD-10-CM

## 2015-11-26 LAB — POCT URINE PREGNANCY: Preg Test, Ur: NEGATIVE

## 2015-11-26 NOTE — Progress Notes (Signed)
Sandy Peters is a 25 y.o. year old  female   who presents for placement of a Mirena IUD.She has not had sex since delivery and her pregnancy test today is negative.    The risks and benefits of the method and placement have been thouroughly reviewed with the patient and all questions were answered.  Specifically the patient is aware of failure rate of 08/998, expulsion of the IUD and of possible perforation.  The patient is aware of irregular bleeding due to the method and understands the incidence of irregular bleeding diminishes with time.  Time out was performed.  A Graves speculum was placed.  The cervix was prepped using Betadine. The uterus was found to be neutral and it sounded to 8 cm.  The cervix was grasped with a tenaculum and the IUD was inserted to 8 cm.  It was pulled back 1 cm and the IUD was disengaged.  The strings were trimmed to 3 cm.  Sonogram was performed and the proper placement of the IUD was verified.  The patient was instructed on signs and symptoms of infection and to check for the strings after each menses or each month.  The patient is to refrain from intercourse for 3 days.  The patient is scheduled for a return appointment after her first menses or 4 weeks.  CRESENZO-DISHMAN,Shone Leventhal 11/26/2015 9:58 AM

## 2015-11-26 NOTE — Patient Instructions (Signed)
Levonorgestrel intrauterine device (IUD) What is this medicine? LEVONORGESTREL IUD (LEE voe nor jes trel) is a contraceptive (birth control) device. The device is placed inside the uterus by a healthcare professional. It is used to prevent pregnancy and can also be used to treat heavy bleeding that occurs during your period. Depending on the device, it can be used for 3 to 5 years. This medicine may be used for other purposes; ask your health care provider or pharmacist if you have questions. What should I tell my health care provider before I take this medicine? They need to know if you have any of these conditions: -abnormal Pap smear -cancer of the breast, uterus, or cervix -diabetes -endometritis -genital or pelvic infection now or in the past -have more than one sexual partner or your partner has more than one partner -heart disease -history of an ectopic or tubal pregnancy -immune system problems -IUD in place -liver disease or tumor -problems with blood clots or take blood-thinners -use intravenous drugs -uterus of unusual shape -vaginal bleeding that has not been explained -an unusual or allergic reaction to levonorgestrel, other hormones, silicone, or polyethylene, medicines, foods, dyes, or preservatives -pregnant or trying to get pregnant -breast-feeding How should I use this medicine? This device is placed inside the uterus by a health care professional. Talk to your pediatrician regarding the use of this medicine in children. Special care may be needed. Overdosage: If you think you have taken too much of this medicine contact a poison control center or emergency room at once. NOTE: This medicine is only for you. Do not share this medicine with others. What if I miss a dose? This does not apply. What may interact with this medicine? Do not take this medicine with any of the following medications: -amprenavir -bosentan -fosamprenavir This medicine may also interact with  the following medications: -aprepitant -barbiturate medicines for inducing sleep or treating seizures -bexarotene -griseofulvin -medicines to treat seizures like carbamazepine, ethotoin, felbamate, oxcarbazepine, phenytoin, topiramate -modafinil -pioglitazone -rifabutin -rifampin -rifapentine -some medicines to treat HIV infection like atazanavir, indinavir, lopinavir, nelfinavir, tipranavir, ritonavir -St. John's wort -warfarin This list may not describe all possible interactions. Give your health care provider a list of all the medicines, herbs, non-prescription drugs, or dietary supplements you use. Also tell them if you smoke, drink alcohol, or use illegal drugs. Some items may interact with your medicine. What should I watch for while using this medicine? Visit your doctor or health care professional for regular check ups. See your doctor if you or your partner has sexual contact with others, becomes HIV positive, or gets a sexual transmitted disease. This product does not protect you against HIV infection (AIDS) or other sexually transmitted diseases. You can check the placement of the IUD yourself by reaching up to the top of your vagina with clean fingers to feel the threads. Do not pull on the threads. It is a good habit to check placement after each menstrual period. Call your doctor right away if you feel more of the IUD than just the threads or if you cannot feel the threads at all. The IUD may come out by itself. You may become pregnant if the device comes out. If you notice that the IUD has come out use a backup birth control method like condoms and call your health care provider. Using tampons will not change the position of the IUD and are okay to use during your period. What side effects may I notice from receiving this medicine?   Side effects that you should report to your doctor or health care professional as soon as possible: -allergic reactions like skin rash, itching or  hives, swelling of the face, lips, or tongue -fever, flu-like symptoms -genital sores -high blood pressure -no menstrual period for 6 weeks during use -pain, swelling, warmth in the leg -pelvic pain or tenderness -severe or sudden headache -signs of pregnancy -stomach cramping -sudden shortness of breath -trouble with balance, talking, or walking -unusual vaginal bleeding, discharge -yellowing of the eyes or skin Side effects that usually do not require medical attention (report to your doctor or health care professional if they continue or are bothersome): -acne -breast pain -change in sex drive or performance -changes in weight -cramping, dizziness, or faintness while the device is being inserted -headache -irregular menstrual bleeding within first 3 to 6 months of use -nausea This list may not describe all possible side effects. Call your doctor for medical advice about side effects. You may report side effects to FDA at 1-800-FDA-1088. Where should I keep my medicine? This does not apply. NOTE: This sheet is a summary. It may not cover all possible information. If you have questions about this medicine, talk to your doctor, pharmacist, or health care provider.    2016, Elsevier/Gold Standard. (2011-09-15 13:54:04)  

## 2015-11-30 DIAGNOSIS — Z029 Encounter for administrative examinations, unspecified: Secondary | ICD-10-CM

## 2015-12-24 ENCOUNTER — Encounter: Payer: Self-pay | Admitting: Advanced Practice Midwife

## 2015-12-24 ENCOUNTER — Ambulatory Visit (INDEPENDENT_AMBULATORY_CARE_PROVIDER_SITE_OTHER): Payer: Medicaid Other | Admitting: Advanced Practice Midwife

## 2015-12-24 VITALS — BP 100/68 | HR 80 | Ht 66.0 in | Wt 153.0 lb

## 2015-12-24 DIAGNOSIS — Z30431 Encounter for routine checking of intrauterine contraceptive device: Secondary | ICD-10-CM | POA: Diagnosis not present

## 2015-12-24 NOTE — Progress Notes (Signed)
   [facility]  Patient name: Sandy Peters MRN [id number]  Date of birth: 12/10/1990  CC & HPI:  Sandy Peters is a 25 y.o. African American female presenting today for IUD check. She had Mirena placed 4 weeks ago.  Has not had any problems. Spots only, boyfriend can verify its placement  Pertinent History Reviewed:  Medical & Surgical Hx:   Past Medical History  Diagnosis Date  . Pregnant 03/03/2015  . Medical history non-contributory    Past Surgical History  Procedure Laterality Date  . No past surgeries     Family History  Problem Relation Age of Onset  . Hypertension Mother   . Anemia Maternal Grandmother   . Hypertension Maternal Grandmother   . Thyroid disease Maternal Grandmother   . Cancer Maternal Grandfather     lung, brain    Current outpatient prescriptions:  .  levonorgestrel (MIRENA) 20 MCG/24HR IUD, 1 each by Intrauterine route once., Disp: , Rfl:  Social History: Reviewed -  reports that she has never smoked. She has never used smokeless tobacco.  Review of Systems:   No complaints  Objective Findings:    Physical Examination: General appearance - well appearing, and in no distress Mental status - alert, oriented to person, place, and time Chest:  Normal respiratory effort Pelvic: Strings visible, scant amount normal appearing dc Musculoskeletal:  Normal range of motion without pain Extremities:  No edema    No results found for this or any previous visit (from the past 24 hour(s)).    Assessment & Plan:  A:   IUD check, normal P:    Return if symptoms worsen or fail to improve.  CRESENZO-DISHMAN,Yoltzin Ransom CNM 12/24/2015 11:04 AM

## 2016-05-26 ENCOUNTER — Ambulatory Visit: Payer: Medicaid Other | Admitting: Advanced Practice Midwife

## 2016-06-14 ENCOUNTER — Ambulatory Visit (INDEPENDENT_AMBULATORY_CARE_PROVIDER_SITE_OTHER): Payer: BLUE CROSS/BLUE SHIELD | Admitting: Women's Health

## 2016-06-14 ENCOUNTER — Encounter: Payer: Self-pay | Admitting: Women's Health

## 2016-06-14 VITALS — BP 120/68 | HR 76 | Ht 65.0 in | Wt 156.0 lb

## 2016-06-14 DIAGNOSIS — Z113 Encounter for screening for infections with a predominantly sexual mode of transmission: Secondary | ICD-10-CM

## 2016-06-14 DIAGNOSIS — Z01419 Encounter for gynecological examination (general) (routine) without abnormal findings: Secondary | ICD-10-CM

## 2016-06-14 DIAGNOSIS — B9689 Other specified bacterial agents as the cause of diseases classified elsewhere: Secondary | ICD-10-CM | POA: Insufficient documentation

## 2016-06-14 DIAGNOSIS — Z308 Encounter for other contraceptive management: Secondary | ICD-10-CM

## 2016-06-14 DIAGNOSIS — N898 Other specified noninflammatory disorders of vagina: Secondary | ICD-10-CM | POA: Diagnosis not present

## 2016-06-14 DIAGNOSIS — N760 Acute vaginitis: Secondary | ICD-10-CM

## 2016-06-14 LAB — POCT WET PREP (WET MOUNT)
Clue Cells Wet Prep Whiff POC: POSITIVE
TRICHOMONAS WET PREP HPF POC: ABSENT

## 2016-06-14 MED ORDER — METRONIDAZOLE 500 MG PO TABS: 500.0000 mg | ORAL_TABLET | Freq: Two times a day (BID) | ORAL | 0 refills | Status: DC

## 2016-06-14 NOTE — Progress Notes (Signed)
Subjective:   Sandy Peters is a 25 y.o. 151P1001 African American female here for a routine [facility] well-woman exam.  No LMP recorded. Patient is not currently having periods (Reason: IUD).    Current complaints: intermittent vaginal d/c w/ odor since getting IUD PCP: [facility]       [facility] labs  Social History: Sexual: heterosexual Marital Status: dating Living situation: w/ boyfriend and children Occupation: Librarian, [employer] Tobacco/alcohol: no tobacco or etoh Illicit drugs: no history of illicit drug use  The following portions of the patient's history were reviewed and updated as appropriate: allergies, current medications, past family history, past medical history, past social history, past surgical history and problem list.  Past Medical History Past Medical History:  Diagnosis Date  . Medical history non-contributory   . Pregnant 03/03/2015    Past Surgical History Past Surgical History:  Procedure Laterality Date  . NO PAST SURGERIES      Gynecologic History G1P1001  No LMP recorded. Patient is not currently having periods (Reason: IUD). Contraception: IUD Last Pap: 2016. Results were: normal Last mammogram: never. Results were: n/a Last TCS: never  Obstetric History OB History  Gravida Para Term Preterm AB Living  1 1 1     1   SAB TAB Ectopic Multiple Live Births        0 1    # Outcome Date GA Lbr Len/2nd Weight Sex Delivery Anes PTL Lv  1 Term 10/11/15 [id number] 07:34 / 01:59 6 lb 15.3 oz (3.155 kg) M Vag-Vacuum EPI  LIV      Current Medications Current Outpatient Prescriptions on File Prior to Visit  Medication Sig Dispense Refill  . levonorgestrel (MIRENA) 20 MCG/24HR IUD 1 each by Intrauterine route once.     No current facility-administered medications on file prior to visit.     Review of Systems Patient denies any headaches, blurred vision, shortness of breath, chest pain, abdominal pain, problems with bowel movements, urination, or  intercourse.  Objective:  BP 120/68 (BP Location: Right Arm, Patient Position: Sitting, Cuff Size: Normal)   Pulse 76   Ht 5\' 5"  (1.651 m)   Wt 156 lb (70.8 kg)   BMI 25.96 kg/m  Physical Exam  General:  Well developed, well nourished, no acute distress. She is alert and oriented x3. Skin:  Warm and dry Neck:  Midline trachea, no thyromegaly or nodules Cardiovascular: Regular rate and rhythm, no murmur heard Lungs:  Effort normal, all lung fields clear to auscultation bilaterally Breasts:  No dominant palpable mass, retraction, or nipple discharge Abdomen:  Soft, non tender, no hepatosplenomegaly or masses Pelvic:  External genitalia is normal in appearance.  The vagina is normal in appearance. Small amt thin white malodorous d/c. The cervix is bulbous, no CMT, IUD strings visible.  Thin prep pap is not done. Uterus is felt to be normal size, shape, and contour.  No adnexal masses or tenderness noted. Extremities:  No swelling or varicosities noted Psych:  She has a normal mood and affect  Assessment:   Healthy well-woman exam BV [facility] STD screen  Plan:  GC/CT from urine, HIV, RPR from blood today Rx metronidazole 500mg  BID x 7d for BV, no sex or etoh while taking  F/U 240yr for physical, or sooner if needed Mammogram @24yo  or sooner if problems Colonoscopy @24yo  or sooner if problems  Marge DuncansBooker, Riggin Cuttino Randall CNM, [facility]NP-BC 06/14/2016 12:22 PM

## 2016-06-14 NOTE — Patient Instructions (Signed)

## 2016-06-15 LAB — HIV ANTIBODY (ROUTINE TESTING W REFLEX): HIV SCREEN 4TH GENERATION: NONREACTIVE

## 2016-06-15 LAB — RPR: RPR Ser Ql: NONREACTIVE

## 2016-06-16 LAB — GC/CHLAMYDIA PROBE AMP
CHLAMYDIA, DNA PROBE: NEGATIVE
NEISSERIA GONORRHOEAE BY PCR: NEGATIVE

## 2016-07-01 ENCOUNTER — Encounter (HOSPITAL_COMMUNITY): Payer: Self-pay | Admitting: Emergency Medicine

## 2016-07-01 ENCOUNTER — Emergency Department (HOSPITAL_COMMUNITY)
Admission: EM | Admit: 2016-07-01 | Discharge: 2016-07-01 | Disposition: A | Discharge status: Home / Self Care | Payer: BLUE CROSS/BLUE SHIELD | Attending: Emergency Medicine | Admitting: Emergency Medicine

## 2016-07-01 DIAGNOSIS — K0889 Other specified disorders of teeth and supporting structures: Secondary | ICD-10-CM

## 2016-07-01 DIAGNOSIS — Z79899 Other long term (current) drug therapy: Secondary | ICD-10-CM | POA: Insufficient documentation

## 2016-07-01 MED ORDER — TRAMADOL HCL 50 MG PO TABS: 50.0000 mg | ORAL_TABLET | Freq: Four times a day (QID) | ORAL | 0 refills | Status: DC | PRN

## 2016-07-01 MED ORDER — IBUPROFEN 800 MG PO TABS
800.0000 mg | ORAL_TABLET | Freq: Once | ORAL | Status: AC
  Administered 2016-07-01: 800 mg via ORAL
  Filled 2016-07-01: qty 1

## 2016-07-01 MED ORDER — IBUPROFEN 800 MG PO TABS: 800.0000 mg | ORAL_TABLET | Freq: Three times a day (TID) | ORAL | 0 refills | Status: DC

## 2016-07-01 NOTE — ED Triage Notes (Signed)
Right jaw pain, unable to chew x 2 days, denies broken tooth

## 2016-07-01 NOTE — ED Provider Notes (Signed)
[facility]PT Provider Note   CSN: [id number] Arrival date & time: 07/01/16  1519     History   Chief Complaint Chief Complaint  Patient presents with  . Jaw Pain    HPI Sandy Peters is a 25 y.o. female.  Patient is a 25 year old female with no pertinent past medical history who presents to the ED with complaint of right lower dental pain, onset 2 days. Patient reports having worsening pain to her right lower back molars. She notes she has had her wisdom teeth growing in for the past few months but states her tooth is growing in sideways and thinks that is what is causing her pain. She notes she tried to contact her dentist this week but states she was unable to to schedule an appointment. She denies taking any medications at home for relief. Denies fever, facial/neck swelling, drooling, drainage, dysphagia, trismus, muffled voice, difficulty breathing, vomiting.      Past Medical History:  Diagnosis Date  . Medical history non-contributory   . Pregnant 03/03/2015    Patient Active Problem List   Diagnosis Date Noted  . BV (bacterial vaginosis) 06/14/2016  . Encounter for IUD insertion 11/26/2015    Past Surgical History:  Procedure Laterality Date  . NO PAST SURGERIES      OB History    Gravida Para Term Preterm AB Living   1 1 1     1    SAB TAB Ectopic Multiple Live Births         0 1       Home Medications    Prior to Admission medications   Medication Sig Start Date End Date Taking? Authorizing Provider  levonorgestrel (MIRENA) 20 MCG/24HR IUD 1 each by Intrauterine route once.   Yes Historical Provider, MD  ibuprofen (ADVIL,MOTRIN) 800 MG tablet Take 1 tablet (800 mg total) by mouth 3 (three) times daily. 07/01/16   Barrett HenleNicole Elizabeth Kyna Blahnik, PA-C  metroNIDAZOLE (FLAGYL) 500 MG tablet Take 1 tablet (500 mg total) by mouth 2 (two) times daily. X 7 days. No sex or alcohol while taking Patient not taking: Reported on 07/01/2016 06/14/16   Cheral MarkerKimberly R  Booker, CNM  traMADol (ULTRAM) 50 MG tablet Take 1 tablet (50 mg total) by mouth every 6 (six) hours as needed. 07/01/16   Barrett HenleNicole Elizabeth Marciano Mundt, PA-C    Family History Family History  Problem Relation Age of Onset  . Hypertension Mother   . Anemia Maternal Grandmother   . Hypertension Maternal Grandmother   . Thyroid disease Maternal Grandmother   . Cancer Maternal Grandfather     lung, brain    Social History Social History  Substance Use Topics  . Smoking status: Never Smoker  . Smokeless tobacco: Never Used  . Alcohol use No     Allergies   Penicillins   Review of Systems Review of Systems  HENT: Positive for dental problem.   All other systems reviewed and are negative.    Physical Exam Updated Vital Signs Ht 5\' 5"  (1.651 m)   Wt 72.6 kg   LMP 06/29/2016   BMI 26.63 kg/m   Physical Exam  Constitutional: She is oriented to person, place, and time. She appears well-developed and well-nourished.  HENT:  Head: Normocephalic and atraumatic.  Nose: Nose normal. Right sinus exhibits no maxillary sinus tenderness and no frontal sinus tenderness. Left sinus exhibits no maxillary sinus tenderness and no frontal sinus tenderness.  Mouth/Throat: Uvula is midline, oropharynx is clear and moist and mucous membranes  are normal. No oral lesions. No trismus in the jaw. Normal dentition. No dental abscesses, uvula swelling, lacerations or dental caries. No oropharyngeal exudate, posterior oropharyngeal edema, posterior oropharyngeal erythema or tonsillar abscesses. No tonsillar exudate.    Eruption of right lower wisdom tooth visible. TTP over tooth # 31 & 32. No swelling, erythema, warmth, drainage, induration, fluctuance noted to surrounding gingiva. Good dentition throughout. Pt tolerating secretions.   Eyes: Conjunctivae and EOM are normal. Right eye exhibits no discharge. Left eye exhibits no discharge. No scleral icterus.  Neck: Normal range of motion. Neck supple.    Cardiovascular: Normal rate, regular rhythm, normal heart sounds and intact distal pulses.   Pulmonary/Chest: Effort normal and breath sounds normal. No respiratory distress. She has no wheezes. She has no rales. She exhibits no tenderness.  Lymphadenopathy:    She has no cervical adenopathy.  Neurological: She is alert and oriented to person, place, and time.  Skin: Skin is warm and dry.  Nursing note and vitals reviewed.    ED Treatments / Results  Labs (all labs ordered are listed, but only abnormal results are displayed) Labs Reviewed - No data to display  EKG  EKG Interpretation None       Radiology No results found.  Procedures Procedures (including critical care time)  Medications Ordered in ED Medications  ibuprofen (ADVIL,MOTRIN) tablet 800 mg (not administered)     Initial Impression / Assessment and Plan / ED Course  I have reviewed the triage vital signs and the nursing notes.  Pertinent labs & imaging results that were available during my care of the patient were reviewed by me and considered in my medical decision making (see chart for details).  Clinical Course    Patient with toothache.  No gross abscess. Patient's pain appears to be related to her right lower wisdom tooth.  Exam unconcerning for Ludwig's angina or spread of infection. Patient declined dental block. Will treat with pain medicine and symptomatic tx.  Urged patient to follow-up with her dentist.  Discussed strict return precautions.  Final Clinical Impressions(s) / ED Diagnoses   Final diagnoses:  Pain, dental    New Prescriptions New Prescriptions   IBUPROFEN (ADVIL,MOTRIN) 800 MG TABLET    Take 1 tablet (800 mg total) by mouth 3 (three) times daily.   TRAMADOL (ULTRAM) 50 MG TABLET    Take 1 tablet (50 mg total) by mouth every 6 (six) hours as needed.     Satira Sarkicole Elizabeth GoldenNadeau, [place] JerseyPA-C 07/01/16 1635    Bethann BerkshireJoseph Zammit, MD 07/04/16 [phone]

## 2016-07-01 NOTE — Discharge Instructions (Signed)
Take your medications as prescribed as needed for pain relief. You may also apply ice to affected area for 15 minutes 3-4 times daily for additional pain relief. I recommend calling your dentist to schedule a follow-up appointment as soon as possible for further management of your dental pain. Please return to the Emergency Department if symptoms worsen or new onset of fever, facial/neck swelling, drainage, difficulty swallowing resulting in drooling, difficulty breathing, unable to open jaw completely, muffled/change of voice, vomiting, unable to keep fluids down.

## 2017-03-07 ENCOUNTER — Ambulatory Visit: Payer: BLUE CROSS/BLUE SHIELD | Admitting: Nurse Practitioner

## 2017-03-08 ENCOUNTER — Telehealth: Payer: Self-pay | Admitting: Nurse Practitioner

## 2017-03-08 ENCOUNTER — Encounter: Payer: Self-pay | Admitting: Nurse Practitioner

## 2017-06-16 ENCOUNTER — Other Ambulatory Visit: Payer: BLUE CROSS/BLUE SHIELD | Admitting: Women's Health

## 2017-06-26 ENCOUNTER — Ambulatory Visit (INDEPENDENT_AMBULATORY_CARE_PROVIDER_SITE_OTHER): Payer: BLUE CROSS/BLUE SHIELD | Admitting: Women's Health

## 2017-06-26 ENCOUNTER — Encounter: Payer: Self-pay | Admitting: Women's Health

## 2017-06-26 ENCOUNTER — Encounter (INDEPENDENT_AMBULATORY_CARE_PROVIDER_SITE_OTHER): Payer: Self-pay

## 2017-06-26 VITALS — BP 104/62 | HR 86 | Ht 65.0 in | Wt 168.0 lb

## 2017-06-26 DIAGNOSIS — Z113 Encounter for screening for infections with a predominantly sexual mode of transmission: Secondary | ICD-10-CM | POA: Diagnosis not present

## 2017-06-26 DIAGNOSIS — B9689 Other specified bacterial agents as the cause of diseases classified elsewhere: Secondary | ICD-10-CM | POA: Diagnosis not present

## 2017-06-26 DIAGNOSIS — Z01419 Encounter for gynecological examination (general) (routine) without abnormal findings: Secondary | ICD-10-CM

## 2017-06-26 DIAGNOSIS — N898 Other specified noninflammatory disorders of vagina: Secondary | ICD-10-CM

## 2017-06-26 DIAGNOSIS — Z01411 Encounter for gynecological examination (general) (routine) with abnormal findings: Secondary | ICD-10-CM | POA: Diagnosis not present

## 2017-06-26 DIAGNOSIS — N760 Acute vaginitis: Secondary | ICD-10-CM | POA: Diagnosis not present

## 2017-06-26 DIAGNOSIS — Z3009 Encounter for other general counseling and advice on contraception: Secondary | ICD-10-CM | POA: Diagnosis not present

## 2017-06-26 LAB — POCT WET PREP (WET MOUNT)
Clue Cells Wet Prep Whiff POC: POSITIVE
Trichomonas Wet Prep HPF POC: ABSENT

## 2017-06-26 MED ORDER — METRONIDAZOLE 500 MG PO TABS: 500.0000 mg | ORAL_TABLET | Freq: Two times a day (BID) | ORAL | 0 refills | Status: DC

## 2017-06-26 NOTE — Progress Notes (Signed)
WELL-WOMAN EXAMINATION Patient name: Sandy Peters MRN [id number]  Date of birth: 06/30/1991 Chief Complaint:   Annual Exam  History of Present Illness:   Sandy Peters is a 26 y.o. 411P1001 African American female being seen today for a routine well-woman exam.  Current complaints: none  [facility]      does desire labs Patient's last menstrual period was 06/19/2017. The current method of family planning is IUD-Mirena, placed 11/26/15 Last pap 03/23/15. Results were: normal Last mammogram: never. Results were: n/a Last colonoscopy: never. Results were: n/a  Review of Systems:   Pertinent items are noted in HPI Denies any headaches, blurred vision, fatigue, shortness of breath, chest pain, abdominal pain, abnormal vaginal discharge/itching/odor/irritation, problems with periods, bowel movements, urination, or intercourse unless otherwise stated above. Pertinent History Reviewed:  Reviewed past medical,surgical, social and family history.  Reviewed problem list, medications and allergies. Physical Assessment:   Vitals:   06/26/17 1510  BP: 104/62  Pulse: 86  Weight: 168 lb (76.2 kg)  Height: 5\' 5"  (1.651 m)  Body mass index is 27.96 kg/m.        Physical Examination:   General appearance - well appearing, and in no distress  Mental status - alert, oriented to person, place, and time  Psych:  She has a normal mood and affect  Skin - warm and dry, normal color, no suspicious lesions noted  Chest - effort normal, all lung fields clear to auscultation bilaterally  Heart - normal rate and regular rhythm  Neck:  midline trachea, no thyromegaly or nodules  Breasts - breasts appear normal, no suspicious masses, no skin or nipple changes or  axillary nodes  Abdomen - soft, nontender, nondistended, no masses or organomegaly  Pelvic - VULVA: normal appearing vulva with no masses, tenderness or lesions  VAGINA: normal appearing vagina with normal color and thin white  malodorous discharge, no lesions  CERVIX: normal appearing cervix without discharge or lesions, no CMT  Thin prep pap is not done  UTERUS: uterus is felt to be normal size, shape, consistency and nontender   ADNEXA: No adnexal masses or tenderness noted.  Extremities:  No swelling or varicosities noted  Results for orders placed or performed in visit on 06/26/17 (from the past 24 hour(s))  POCT Wet Prep [facility]ody Drown([place] [place])   Collection Time: 06/26/17  3:41 PM  Result Value Ref Range   Source Wet Prep POC vaginal    WBC, Wet Prep HPF POC mod    Bacteria Wet Prep HPF POC None (A) Few   BACTERIA WET PREP MORPHOLOGY POC     Clue Cells Wet Prep HPF POC Many (A) None   Clue Cells Wet Prep Whiff POC Positive Whiff    Yeast Wet Prep HPF POC None    KOH Wet Prep POC     Trichomonas Wet Prep HPF POC Absent Absent    Assessment & Plan:  1) Well-Woman Exam  2) BV> Rx metronidazole 500mg  BID x 7d for BV, no sex or etoh while taking   3) STD screen> gc/ct, HIV, RPR per [facility]  Mammogram @25yo  or sooner if problems Colonoscopy @25yo  or sooner if problems  Orders Placed This Encounter  Procedures  . GC/Chlamydia Probe Amp  . CBC  . Comprehensive metabolic panel  . TSH  . Hemoglobin A1c  . HIV antibody  . RPR  . POCT Wet Prep [facility]([place] [place])    Follow-up: Return in about 1 year (around 06/26/2018) for Pap & physical.  Marge Duncans CNM, [facility] 06/26/2017 3:43 PM

## 2017-06-27 LAB — COMPREHENSIVE METABOLIC PANEL
A/G RATIO: 1.6 (ref 1.2–2.2)
ALBUMIN: 4.8 g/dL (ref 3.5–5.5)
ALT: 16 IU/L (ref 0–32)
AST: 16 IU/L (ref 0–40)
Alkaline Phosphatase: 78 IU/L (ref 39–117)
BILIRUBIN TOTAL: 0.5 mg/dL (ref 0.0–1.2)
BUN / CREAT RATIO: 25 — AB (ref 9–23)
BUN: 15 mg/dL (ref 6–20)
CHLORIDE: 98 mmol/L (ref 96–106)
CO2: 24 mmol/L (ref 20–29)
Calcium: 9.8 mg/dL (ref 8.7–10.2)
Creatinine, Ser: 0.61 mg/dL (ref 0.57–1.00)
GFR calc Af Amer: 146 mL/min/{1.73_m2} (ref >59)
GFR calc non Af Amer: 126 mL/min/{1.73_m2} (ref >59)
Globulin, Total: 3 g/dL (ref 1.5–4.5)
Glucose: 82 mg/dL (ref 65–99)
Potassium: 3.6 mmol/L (ref 3.5–5.2)
Sodium: 139 mmol/L (ref 134–144)
TOTAL PROTEIN: 7.8 g/dL (ref 6.0–8.5)

## 2017-06-27 LAB — CBC
HEMOGLOBIN: 12.7 g/dL (ref 11.1–15.9)
Hematocrit: 38.3 % (ref 34.0–46.6)
MCH: 30.2 pg (ref 26.6–33.0)
MCHC: 33.2 g/dL (ref 31.5–35.7)
MCV: 91 fL (ref 79–97)
Platelets: 255 10*3/uL (ref 150–379)
RBC: 4.21 x10E6/uL (ref 3.77–5.28)
RDW: 13.1 % (ref 12.3–15.4)
WBC: 4.9 10*3/uL (ref 3.4–10.8)

## 2017-06-27 LAB — RPR: RPR: NONREACTIVE

## 2017-06-27 LAB — HEMOGLOBIN A1C
ESTIMATED AVERAGE GLUCOSE: 103 mg/dL
Hgb A1c MFr Bld: 5.2 % (ref 4.8–5.6)

## 2017-06-27 LAB — HIV ANTIBODY (ROUTINE TESTING W REFLEX): HIV SCREEN 4TH GENERATION: NONREACTIVE

## 2017-06-27 LAB — TSH: TSH: 0.618 u[IU]/mL (ref 0.450–4.500)

## 2017-06-28 LAB — GC/CHLAMYDIA PROBE AMP
Chlamydia trachomatis, NAA: NEGATIVE
Neisseria gonorrhoeae by PCR: NEGATIVE

## 2017-07-07 ENCOUNTER — Emergency Department (HOSPITAL_COMMUNITY): Payer: BLUE CROSS/BLUE SHIELD

## 2017-07-07 ENCOUNTER — Emergency Department (HOSPITAL_COMMUNITY)
Admission: EM | Admit: 2017-07-07 | Discharge: 2017-07-07 | Disposition: A | Discharge status: Home / Self Care | Payer: BLUE CROSS/BLUE SHIELD | Attending: Emergency Medicine | Admitting: Emergency Medicine

## 2017-07-07 ENCOUNTER — Encounter (HOSPITAL_COMMUNITY): Payer: Self-pay | Admitting: *Deleted

## 2017-07-07 DIAGNOSIS — S39012A Strain of muscle, fascia and tendon of lower back, initial encounter: Secondary | ICD-10-CM | POA: Insufficient documentation

## 2017-07-07 DIAGNOSIS — Y92410 Unspecified street and highway as the place of occurrence of the external cause: Secondary | ICD-10-CM | POA: Diagnosis not present

## 2017-07-07 DIAGNOSIS — Y9389 Activity, other specified: Secondary | ICD-10-CM | POA: Insufficient documentation

## 2017-07-07 DIAGNOSIS — Y999 Unspecified external cause status: Secondary | ICD-10-CM | POA: Insufficient documentation

## 2017-07-07 DIAGNOSIS — S3992XA Unspecified injury of lower back, initial encounter: Secondary | ICD-10-CM | POA: Diagnosis present

## 2017-07-07 LAB — PREGNANCY, URINE: Preg Test, Ur: NEGATIVE

## 2017-07-07 MED ORDER — NAPROXEN 500 MG PO TABS: 500.0000 mg | ORAL_TABLET | Freq: Two times a day (BID) | ORAL | 0 refills | Status: DC

## 2017-07-07 MED ORDER — CYCLOBENZAPRINE HCL 10 MG PO TABS: 10.0000 mg | ORAL_TABLET | Freq: Two times a day (BID) | ORAL | 0 refills | Status: DC | PRN

## 2017-07-07 NOTE — ED Notes (Signed)
Patient transported to CT 
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
## 2017-07-07 NOTE — Discharge Instructions (Signed)
Work note provided.  CT scan of the lumbar back without any acute bony abnormalities.  Expect to be sore and stiff over the next 2 days.  Take the Naprosyn on a regular basis.  Take the muscle relaxer as needed.  Will make you drowsy so he may want to take it at night.  Return for new or worse symptoms.
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
## 2017-12-05 ENCOUNTER — Other Ambulatory Visit: Payer: Self-pay

## 2017-12-05 ENCOUNTER — Emergency Department (HOSPITAL_COMMUNITY)
Admission: EM | Admit: 2017-12-05 | Discharge: 2017-12-06 | Disposition: A | Discharge status: Home / Self Care | Payer: BLUE CROSS/BLUE SHIELD | Attending: Emergency Medicine | Admitting: Emergency Medicine

## 2017-12-05 ENCOUNTER — Encounter (HOSPITAL_COMMUNITY): Payer: Self-pay | Admitting: *Deleted

## 2017-12-05 DIAGNOSIS — Z79899 Other long term (current) drug therapy: Secondary | ICD-10-CM | POA: Insufficient documentation

## 2017-12-05 DIAGNOSIS — R112 Nausea with vomiting, unspecified: Secondary | ICD-10-CM

## 2017-12-05 LAB — CBC
HCT: 36.3 % (ref 36.0–46.0)
Hemoglobin: 11.9 g/dL — ABNORMAL LOW (ref 12.0–15.0)
MCH: 29.9 pg (ref 26.0–34.0)
MCHC: 32.8 g/dL (ref 30.0–36.0)
MCV: 91.2 fL (ref 78.0–100.0)
PLATELETS: 189 10*3/uL (ref 150–400)
RBC: 3.98 MIL/uL (ref 3.87–5.11)
RDW: 12.6 % (ref 11.5–15.5)
WBC: 4.5 10*3/uL (ref 4.0–10.5)

## 2017-12-05 LAB — LIPASE, BLOOD: LIPASE: 20 U/L (ref 11–51)

## 2017-12-05 LAB — URINALYSIS, ROUTINE W REFLEX MICROSCOPIC
BILIRUBIN URINE: NEGATIVE
GLUCOSE, UA: NEGATIVE mg/dL
KETONES UR: 5 mg/dL — AB
LEUKOCYTES UA: NEGATIVE
NITRITE: NEGATIVE
PH: 6 (ref 5.0–8.0)
Protein, ur: NEGATIVE mg/dL
SPECIFIC GRAVITY, URINE: 1.021 (ref 1.005–1.030)

## 2017-12-05 LAB — COMPREHENSIVE METABOLIC PANEL
ALT: 35 U/L (ref 14–54)
AST: 21 U/L (ref 15–41)
Albumin: 4.1 g/dL (ref 3.5–5.0)
Alkaline Phosphatase: 72 U/L (ref 38–126)
Anion gap: 11 (ref 5–15)
BILIRUBIN TOTAL: 0.8 mg/dL (ref 0.3–1.2)
BUN: 12 mg/dL (ref 6–20)
CALCIUM: 8.9 mg/dL (ref 8.9–10.3)
CHLORIDE: 98 mmol/L — AB (ref 101–111)
CO2: 26 mmol/L (ref 22–32)
CREATININE: 0.5 mg/dL (ref 0.44–1.00)
GFR calc non Af Amer: >60 mL/min (ref >60)
Glucose, Bld: 98 mg/dL (ref 65–99)
Potassium: 3.4 mmol/L — ABNORMAL LOW (ref 3.5–5.1)
Sodium: 135 mmol/L (ref 135–145)
TOTAL PROTEIN: 8.1 g/dL (ref 6.5–8.1)

## 2017-12-05 LAB — PREGNANCY, URINE: Preg Test, Ur: NEGATIVE

## 2017-12-05 MED ORDER — ONDANSETRON HCL 4 MG PO TABS: 4.0000 mg | ORAL_TABLET | Freq: Four times a day (QID) | ORAL | 0 refills | Status: DC

## 2017-12-05 MED ORDER — FAMOTIDINE 20 MG PO TABS
20.0000 mg | ORAL_TABLET | Freq: Once | ORAL | Status: AC
  Administered 2017-12-05: 20 mg via ORAL
  Filled 2017-12-05: qty 1

## 2017-12-05 MED ORDER — ONDANSETRON 4 MG PO TBDP
4.0000 mg | ORAL_TABLET | Freq: Once | ORAL | Status: AC | PRN
  Administered 2017-12-05: 4 mg via ORAL
  Filled 2017-12-05: qty 1

## 2017-12-05 NOTE — Discharge Instructions (Addendum)
Frequent, small amounts of clear fluids tonight and tomorrow.  You may start bland diet as tolerated tomorrow.  As discussed, return to the ER for any worsening symptoms such as fever, persistent vomiting, or increasing pain to your abdomen that radiates to the right lower side

## 2017-12-05 NOTE — ED Triage Notes (Signed)
Pt c/o n/v, headache that started during the night after eating at [employer], denies any diarrhea.

## 2017-12-07 NOTE — ED Provider Notes (Signed)
[facility] EMERGENCY DEPARTMENT Provider Note   CSN: [id number] Arrival date & time: 12/05/17  2006     History   Chief Complaint Chief Complaint  Patient presents with  . Emesis    HPI Sandy Peters is a 27 y.o. female.  HPI   Sandy Peters is a 27 y.o. female who presents to the Emergency Department complaining of nausea, vomiting and frontal headache.  Symptoms began approximately 6 hours after eating at a [employer].  She reports multiple episodes of vomiting and unable to keep down any fluids.  Headache of gradual onset and began after several episodes of vomiting.  She reports cramping pain to her upper stomach that worsens just prior to vomiting.  She denies fever, diarrhea dysuria, pelvic pain.  She has not tried any therapies prior to arrival.   Past Medical History:  Diagnosis Date  . Medical history non-contributory   . Pregnant 03/03/2015    Patient Active Problem List   Diagnosis Date Noted  . BV (bacterial vaginosis) 06/14/2016  . Encounter for IUD insertion 11/26/2015    Past Surgical History:  Procedure Laterality Date  . NO PAST SURGERIES       OB History    Gravida  1   Para  1   Term  1   Preterm      AB      Living  1     SAB      TAB      Ectopic      Multiple  0   Live Births  1            Home Medications    Prior to Admission medications   Medication Sig Start Date End Date Taking? Authorizing Provider  acetaminophen (TYLENOL) 500 MG tablet Take 1,000 mg by mouth every 6 (six) hours as needed for mild pain or moderate pain.   Yes [provider]  levonorgestrel (MIRENA) 20 MCG/24HR IUD 1 each by Intrauterine route once.   Yes [provider]  ondansetron (ZOFRAN) 4 MG tablet Take 1 tablet (4 mg total) by mouth every 6 (six) hours. As needed for vomiting 12/05/17   Pauline Aus, PA-C    Family History Family History  Problem Relation Age of Onset  . Hypertension Mother   . Anemia  Maternal Grandmother   . Hypertension Maternal Grandmother   . Thyroid disease Maternal Grandmother   . Cancer Maternal Grandfather        lung, brain    Social History Social History   Tobacco Use  . Smoking status: Never Smoker  . Smokeless tobacco: Never Used  Substance Use Topics  . Alcohol use: No  . Drug use: No     Allergies   Penicillins   Review of Systems Review of Systems  Constitutional: Negative for appetite change, chills and fever.  Respiratory: Negative for shortness of breath.   Cardiovascular: Negative for chest pain.  Gastrointestinal: Positive for nausea and vomiting. Negative for abdominal pain (crampy abdominal pain), blood in stool and diarrhea.  Genitourinary: Negative for decreased urine volume, difficulty urinating, dysuria and flank pain.  Musculoskeletal: Negative for back pain, neck pain and neck stiffness.  Skin: Negative for color change and rash.  Neurological: Positive for headaches. Negative for dizziness, weakness and numbness.  Hematological: Negative for adenopathy.  All other systems reviewed and are negative.    Physical Exam Updated Vital Signs BP 110/77   Pulse 100   Temp 99.1 F (37.3  C) (Oral)   Resp 18   Ht 5\' 6"  (1.676 m)   Wt 77.1 kg (170 lb)   LMP 11/19/2017   SpO2 100%   BMI 27.44 kg/m   Physical Exam  Constitutional: She is oriented to person, place, and time. She appears well-developed and well-nourished. No distress.  HENT:  Head: Normocephalic and atraumatic.  Mouth/Throat: Oropharynx is clear and moist and mucous membranes are normal.  Neck: Normal range of motion and phonation normal. No Kernig's sign noted.  Cardiovascular: Normal rate, regular rhythm and intact distal pulses.  No murmur heard. Pulmonary/Chest: Effort normal and breath sounds normal. No respiratory distress.  Abdominal: Soft. Bowel sounds are normal. She exhibits no distension and no mass. There is tenderness (mild epigastric  tenderness with deep palpation.  no pain to RLQ.  no guarding or rebound tenderness. ). There is no rebound and no guarding.  Musculoskeletal: Normal range of motion. She exhibits no edema.  Neurological: She is alert and oriented to person, place, and time. No sensory deficit. She exhibits normal muscle tone. Coordination normal.  Skin: Skin is warm and dry. Capillary refill takes less than 2 seconds.  Psychiatric: She has a normal mood and affect.  Nursing note and vitals reviewed.    ED Treatments / Results  Labs (all labs ordered are listed, but only abnormal results are displayed) Labs Reviewed  COMPREHENSIVE METABOLIC PANEL - Abnormal; Notable for the following components:      Result Value   Potassium 3.4 (*)    Chloride 98 (*)    All other components within normal limits  CBC - Abnormal; Notable for the following components:   Hemoglobin 11.9 (*)    All other components within normal limits  URINALYSIS, ROUTINE W REFLEX MICROSCOPIC - Abnormal; Notable for the following components:   APPearance HAZY (*)    Hgb urine dipstick MODERATE (*)    Ketones, ur 5 (*)    Bacteria, UA RARE (*)    Squamous Epithelial / LPF 0-5 (*)    All other components within normal limits  LIPASE, BLOOD  PREGNANCY, URINE    EKG None  Radiology No results found.  Procedures Procedures (including critical care time)  Medications Ordered in ED Medications  ondansetron (ZOFRAN-ODT) disintegrating tablet 4 mg (4 mg Oral Given 12/05/17 2026)  famotidine (PEPCID) tablet 20 mg (20 mg Oral Given 12/05/17 2356)     Initial Impression / Assessment and Plan / ED Course  I have reviewed the triage vital signs and the nursing notes.  Pertinent labs & imaging results that were available during my care of the patient were reviewed by me and considered in my medical decision making (see chart for details).     Pt feeling better after anti-emetic.  Mild epigastric tenderness w/o guarding or rebound  tenderness.  She reports feeling better and requesting d/c home.  Has tolerated po fluids.  No concerning sx's for acute abdomen.  Discussed importance of return for any worsening symptoms such as pain localizing to RLQ, vomiting, fever or continued vomiting.    Final Clinical Impressions(s) / ED Diagnoses   Final diagnoses:  Non-intractable vomiting with nausea, unspecified vomiting type    ED Discharge Orders        Ordered    ondansetron (ZOFRAN) 4 MG tablet  Every 6 hours     12/05/17 2341       Pauline Ausriplett, Earnest Thalman, PA-C 12/07/17 2243    Doug SouJacubowitz, Sam, MD 12/08/17 1355

## 2019-11-21 ENCOUNTER — Telehealth: Payer: Self-pay | Admitting: Women's Health

## 2019-11-21 NOTE — Telephone Encounter (Signed)

## 2019-11-25 ENCOUNTER — Other Ambulatory Visit: Payer: Self-pay | Admitting: Women's Health

## 2019-12-22 ENCOUNTER — Other Ambulatory Visit: Payer: Self-pay

## 2019-12-22 ENCOUNTER — Encounter (HOSPITAL_COMMUNITY): Payer: Self-pay | Admitting: Emergency Medicine

## 2019-12-22 ENCOUNTER — Emergency Department (HOSPITAL_COMMUNITY)
Admission: EM | Admit: 2019-12-22 | Discharge: 2019-12-23 | Disposition: A | Discharge status: Home / Self Care | Payer: Medicaid Other | Attending: Emergency Medicine | Admitting: Emergency Medicine

## 2019-12-22 DIAGNOSIS — Y939 Activity, unspecified: Secondary | ICD-10-CM | POA: Insufficient documentation

## 2019-12-22 DIAGNOSIS — Y999 Unspecified external cause status: Secondary | ICD-10-CM | POA: Insufficient documentation

## 2019-12-22 DIAGNOSIS — S0993XA Unspecified injury of face, initial encounter: Secondary | ICD-10-CM | POA: Diagnosis present

## 2019-12-22 DIAGNOSIS — S0181XA Laceration without foreign body of other part of head, initial encounter: Secondary | ICD-10-CM | POA: Insufficient documentation

## 2019-12-22 DIAGNOSIS — W25XXXA Contact with sharp glass, initial encounter: Secondary | ICD-10-CM | POA: Insufficient documentation

## 2019-12-22 DIAGNOSIS — Y92410 Unspecified street and highway as the place of occurrence of the external cause: Secondary | ICD-10-CM | POA: Diagnosis not present
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
## 2019-12-24 ENCOUNTER — Encounter (HOSPITAL_COMMUNITY): Payer: Self-pay | Admitting: Emergency Medicine

## 2019-12-24 ENCOUNTER — Other Ambulatory Visit: Payer: Self-pay

## 2019-12-24 ENCOUNTER — Emergency Department (HOSPITAL_COMMUNITY): Payer: Medicaid Other

## 2019-12-24 ENCOUNTER — Emergency Department (HOSPITAL_COMMUNITY)
Admission: EM | Admit: 2019-12-24 | Discharge: 2019-12-24 | Disposition: A | Discharge status: Home / Self Care | Payer: Medicaid Other | Attending: Emergency Medicine | Admitting: Emergency Medicine

## 2019-12-24 DIAGNOSIS — G44319 Acute post-traumatic headache, not intractable: Secondary | ICD-10-CM | POA: Diagnosis not present

## 2019-12-24 DIAGNOSIS — S0990XD Unspecified injury of head, subsequent encounter: Secondary | ICD-10-CM | POA: Insufficient documentation

## 2019-12-24 DIAGNOSIS — Z79899 Other long term (current) drug therapy: Secondary | ICD-10-CM | POA: Diagnosis not present

## 2019-12-24 DIAGNOSIS — R519 Headache, unspecified: Secondary | ICD-10-CM | POA: Diagnosis present

## 2019-12-24 LAB — POC URINE PREG, ED: Preg Test, Ur: NEGATIVE

## 2019-12-24 MED ORDER — ACETAMINOPHEN 500 MG PO TABS
1000.0000 mg | ORAL_TABLET | Freq: Once | ORAL | Status: AC
Start: 2019-12-24 — End: 2019-12-24
  Administered 2019-12-24: 10:00:00 1000 mg via ORAL
  Filled 2019-12-24: qty 2
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
## 2019-12-24 NOTE — Discharge Instructions (Signed)
Your exam and imaging are reassuring today with no unexpected findings.  It can be normal to have a headache for days after a head injury.  Refer to the concussion injury instructions below as you may have a mild concussion.    Avoid any activity that worsens your headache pain.  You may benefit from frequent naps, avoid stressful situations, activities that require significant concentration (more than 10 minutes)  for the rest of this week. Continue using tylenol or ibuprofen for headache relief.
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
## 2020-11-17 ENCOUNTER — Encounter: Payer: Self-pay | Admitting: Women's Health

## 2020-11-17 ENCOUNTER — Ambulatory Visit (INDEPENDENT_AMBULATORY_CARE_PROVIDER_SITE_OTHER): Payer: No Typology Code available for payment source | Admitting: Women's Health

## 2020-11-17 ENCOUNTER — Other Ambulatory Visit: Payer: Self-pay

## 2020-11-17 ENCOUNTER — Other Ambulatory Visit (HOSPITAL_COMMUNITY)
Admission: RE | Admit: 2020-11-17 | Discharge: 2020-11-17 | Disposition: A | Discharge status: Home / Self Care | Payer: Medicaid Other | Source: Ambulatory Visit | Attending: Obstetrics & Gynecology | Admitting: Obstetrics & Gynecology

## 2020-11-17 VITALS — BP 113/71 | HR 86 | Ht 65.0 in

## 2020-11-17 DIAGNOSIS — Z01419 Encounter for gynecological examination (general) (routine) without abnormal findings: Secondary | ICD-10-CM

## 2020-11-17 NOTE — Progress Notes (Signed)
   WELL-WOMAN EXAMINATION Patient name: Sandy Peters MRN [id number]  Date of birth: 1990/10/11 Chief Complaint:   Gynecologic Exam  History of Present Illness:   Sandy Peters is a 30 y.o. G27P1001 African American female being seen today for a routine well-woman exam.  Current complaints: none  Depression screen [facility] 2/9 11/17/2020 06/26/2017  Decreased Interest 0 0  Down, Depressed, Hopeless 0 0  PHQ - 2 Score 0 0  Altered sleeping 0 0  Tired, decreased energy 0 0  Change in appetite 0 0  Feeling bad or failure about yourself  0 0  Trouble concentrating 0 0  Moving slowly or fidgety/restless 0 0  Suicidal thoughts 0 0  PHQ-9 Score 0 0     PCP: [facility]      does not desire labs Patient's last menstrual period was 10/21/2020. The current method of family planning is Mirena IUD inserted 11/26/15.  Last pap 2016. Results were: NILM w/ HRHPV not done. H/O abnormal pap: no Last mammogram: never. Results were: N/A. Family h/o breast cancer: no Last colonoscopy: never. Results were: N/A. Family h/o colorectal cancer: no Review of Systems:   Pertinent items are noted in HPI Denies any headaches, blurred vision, fatigue, shortness of breath, chest pain, abdominal pain, abnormal vaginal discharge/itching/odor/irritation, problems with periods, bowel movements, urination, or intercourse unless otherwise stated above. Pertinent History Reviewed:  Reviewed past medical,surgical, social and family history.  Reviewed problem list, medications and allergies. Physical Assessment:   Vitals:   11/17/20 1551  BP: 113/71  Pulse: 86  Height: 5\' 5"  (1.651 m)  Body mass index is 29.45 kg/m.        Physical Examination: by , SNP  General appearance - well appearing, and in no distress  Mental status - alert, oriented to person, place, and time  Psych:  She has a normal mood and affect  Skin - warm and dry, normal color, no suspicious lesions noted  Chest - effort normal, all lung  fields clear to auscultation bilaterally  Heart - normal rate and regular rhythm  Neck:  midline trachea, no thyromegaly or nodules  Breasts - breasts appear normal, no suspicious masses, no skin or nipple changes or  axillary nodes  Abdomen - soft, nontender, nondistended, no masses or organomegaly  Pelvic - VULVA: normal appearing vulva with no masses, tenderness or lesions  VAGINA: normal appearing vagina with normal color and discharge, no lesions  CERVIX: normal appearing cervix without discharge or lesions, no CMT, IUD strings visible, appropriate length  Thin prep pap is done w/ HR HPV cotesting  UTERUS: uterus is felt to be normal size, shape, consistency and nontender   ADNEXA: No adnexal masses or tenderness noted.  Extremities:  No swelling or varicosities noted  Chaperone: me    No results found for this or any previous visit (from the past 24 hour(s)).  Assessment & Plan:  1) Well-Woman Exam  Labs/procedures today: pap  Mammogram: @ 29yo, or sooner if problems Colonoscopy: @ 29yo, or sooner if problems  No orders of the defined types were placed in this encounter.   Meds: No orders of the defined types were placed in this encounter.   Follow-up: Return in about 1 year (around 11/17/2021) for Physical.  11/19/2021 CNM, [facility] 11/17/2020 4:17 PM

## 2020-11-20 LAB — CYTOLOGY - PAP
Chlamydia: NEGATIVE
Comment: NEGATIVE
Comment: NEGATIVE
Comment: NORMAL
Diagnosis: UNDETERMINED — AB
High risk HPV: NEGATIVE
Neisseria Gonorrhea: NEGATIVE

## 2020-11-23 ENCOUNTER — Encounter: Payer: Self-pay | Admitting: Women's Health

## 2020-11-23 DIAGNOSIS — R87619 Unspecified abnormal cytological findings in specimens from cervix uteri: Secondary | ICD-10-CM | POA: Insufficient documentation

## 2022-11-01 ENCOUNTER — Encounter: Payer: Self-pay | Admitting: Women's Health

## 2022-11-01 ENCOUNTER — Ambulatory Visit (INDEPENDENT_AMBULATORY_CARE_PROVIDER_SITE_OTHER): Payer: Self-pay | Admitting: Women's Health

## 2022-11-01 ENCOUNTER — Encounter: Payer: Self-pay | Admitting: Adult Health

## 2022-11-01 VITALS — BP 109/72 | HR 92 | Ht 66.0 in | Wt 194.0 lb

## 2022-11-01 DIAGNOSIS — Z01419 Encounter for gynecological examination (general) (routine) without abnormal findings: Secondary | ICD-10-CM | POA: Diagnosis not present

## 2022-11-01 NOTE — Progress Notes (Signed)
WELL-WOMAN EXAMINATION Patient name: Sandy Peters MRN [id number]  Date of birth: 07-Sep-1990 Chief Complaint:   Gynecologic Exam (Pap/physcial/last pap 11-17-20 [facility])  History of Present Illness:   Sandy Peters is a 32 y.o. G44P1001 African-American female being seen today for a routine well-woman exam.  Current complaints: none  PCP: [facility]      does not desire labs Patient's last menstrual period was 10/07/2022. The current method of family planning is  Mirena inserted 11/26/15 .  Last pap 11/17/20. Results were: ASCUS w/ HRHPV negative. H/O abnormal pap: yes Last mammogram: never. Results were: N/A. Family h/o breast cancer: no Last colonoscopy: never. Results were: N/A. Family h/o colorectal cancer: no     11/01/2022    8:44 AM 11/17/2020    3:46 PM 06/26/2017    3:13 PM  Depression screen PHQ 2/9  Decreased Interest 0 0 0  Down, Depressed, Hopeless 0 0 0  PHQ - 2 Score 0 0 0  Altered sleeping 0 0 0  Tired, decreased energy 0 0 0  Change in appetite 0 0 0  Feeling bad or failure about yourself  0 0 0  Trouble concentrating 0 0 0  Moving slowly or fidgety/restless 0 0 0  Suicidal thoughts 0 0 0  PHQ-9 Score 0 0 0        11/01/2022    8:44 AM 11/17/2020    3:47 PM  GAD 7 : Generalized Anxiety Score  Nervous, Anxious, on Edge 0 0  Control/stop worrying 0 0  Worry too much - different things 0 0  Trouble relaxing 0 0  Restless 0 0  Easily annoyed or irritable 0 0  Afraid - awful might happen 0 0  Total GAD 7 Score 0 0     Review of Systems:   Pertinent items are noted in HPI Denies any headaches, blurred vision, fatigue, shortness of breath, chest pain, abdominal pain, abnormal vaginal discharge/itching/odor/irritation, problems with periods, bowel movements, urination, or intercourse unless otherwise stated above. Pertinent History Reviewed:  Reviewed past medical,surgical, social and family history.  Reviewed problem list, medications and  allergies. Physical Assessment:   Vitals:   11/01/22 0839  BP: 109/72  Pulse: 92  Weight: 194 lb (88 kg)  Height: '5\' 6"'$  (1.676 m)  Body mass index is 31.31 kg/m.        Physical Examination:   General appearance - well appearing, and in no distress  Mental status - alert, oriented to person, place, and time  Psych:  She has a normal mood and affect  Skin - warm and dry, normal color, no suspicious lesions noted  Chest - effort normal, all lung fields clear to auscultation bilaterally  Heart - normal rate and regular rhythm  Neck:  midline trachea, no thyromegaly or nodules  Breasts - breasts appear normal, no suspicious masses, no skin or nipple changes or  axillary nodes  Abdomen - soft, nontender, nondistended, no masses or organomegaly  Pelvic - VULVA: normal appearing vulva with no masses, tenderness or lesions  VAGINA: normal appearing vagina with normal color and discharge, no lesions  CERVIX: normal appearing cervix without discharge or lesions, no CMT, IUD strings visible  Thin prep pap is not done  UTERUS: uterus is felt to be normal size, shape, consistency and nontender   ADNEXA: No adnexal masses or tenderness noted.  Extremities:  No swelling or varicosities noted  Chaperone: Peggy Dones    No results found for this or any previous  visit (from the past 24 hour(s)).  Assessment & Plan:  1) Well-Woman Exam  2) H/O ASCUS pap> w/ -HRHPV, due for repeat next year  Labs/procedures today: exam  Mammogram: @ 31yo, or sooner if problems Colonoscopy: @ 31yo, or sooner if problems  No orders of the defined types were placed in this encounter.   Meds: No orders of the defined types were placed in this encounter.   Follow-up: Return in about 1 year (around 11/01/2023) for Pap & physical.  Roma Schanz CNM, [facility] 11/01/2022 9:12 AM

## 2023-11-22 ENCOUNTER — Encounter: Payer: Self-pay | Admitting: Women's Health

## 2023-11-22 ENCOUNTER — Ambulatory Visit (INDEPENDENT_AMBULATORY_CARE_PROVIDER_SITE_OTHER): Admitting: Women's Health

## 2023-11-22 ENCOUNTER — Other Ambulatory Visit (HOSPITAL_COMMUNITY)
Admission: RE | Admit: 2023-11-22 | Discharge: 2023-11-22 | Disposition: A | Discharge status: Home / Self Care | Source: Ambulatory Visit | Attending: Women's Health | Admitting: Women's Health

## 2023-11-22 ENCOUNTER — Ambulatory Visit: Admitting: Women's Health

## 2023-11-22 VITALS — BP 121/80 | HR 100 | Ht 65.0 in | Wt 182.0 lb

## 2023-11-22 DIAGNOSIS — Z30433 Encounter for removal and reinsertion of intrauterine contraceptive device: Secondary | ICD-10-CM

## 2023-11-22 DIAGNOSIS — R6882 Decreased libido: Secondary | ICD-10-CM

## 2023-11-22 DIAGNOSIS — Z01419 Encounter for gynecological examination (general) (routine) without abnormal findings: Secondary | ICD-10-CM | POA: Insufficient documentation

## 2023-11-22 DIAGNOSIS — Z30432 Encounter for removal of intrauterine contraceptive device: Secondary | ICD-10-CM

## 2023-11-22 DIAGNOSIS — Z30430 Encounter for insertion of intrauterine contraceptive device: Secondary | ICD-10-CM

## 2023-11-22 MED ORDER — PARAGARD INTRAUTERINE COPPER IU IUD
1.0000 | INTRAUTERINE_SYSTEM | Freq: Once | INTRAUTERINE | Status: AC
  Administered 2023-11-22: 1 via INTRAUTERINE

## 2023-11-22 NOTE — Progress Notes (Signed)
 WELL-WOMAN EXAMINATION Patient name: Sandy Peters MRN [id number]  Date of birth: 04/18/1991 Chief Complaint:   Annual Exam (Concerned about hormones-no interest in sex/)  History of Present Illness:   Sandy Peters is a 33 y.o. G33P1001 African-American female being seen today for a routine well-woman exam.  Current complaints: no sex drive, no dep/anx, no meds, child is 5yrs old. Wonders if it is IUD. Wants to try non-hormonal IUD  PCP: [facility]      does not desire labs, does want STD screen on pap No LMP recorded. (Menstrual status: IUD). The current method of family planning is  Mirena inserted 11/26/15 .  Last pap 11/17/20. Results were: ASCUS w/ HRHPV negative. H/O abnormal pap: yes Last mammogram: never. Results were: N/A. Family h/o breast cancer: yes MA in 58s Last colonoscopy: never. Results were: N/A. Family h/o colorectal cancer: no     11/22/2023    2:35 PM 11/01/2022    8:44 AM 11/17/2020    3:46 PM 06/26/2017    3:13 PM  Depression screen PHQ 2/9  Decreased Interest 0 0 0 0  Down, Depressed, Hopeless 0 0 0 0  PHQ - 2 Score 0 0 0 0  Altered sleeping 0 0 0 0  Tired, decreased energy 1 0 0 0  Change in appetite 0 0 0 0  Feeling bad or failure about yourself  0 0 0 0  Trouble concentrating 0 0 0 0  Moving slowly or fidgety/restless 0 0 0 0  Suicidal thoughts 0 0 0 0  PHQ-9 Score 1 0 0 0        11/22/2023    2:35 PM 11/01/2022    8:44 AM 11/17/2020    3:47 PM  GAD 7 : Generalized Anxiety Score  Nervous, Anxious, on Edge 0 0 0  Control/stop worrying 0 0 0  Worry too much - different things 0 0 0  Trouble relaxing 0 0 0  Restless 0 0 0  Easily annoyed or irritable 0 0 0  Afraid - awful might happen 0 0 0  Total GAD 7 Score 0 0 0     Review of Systems:   Pertinent items are noted in HPI Denies any headaches, blurred vision, fatigue, shortness of breath, chest pain, abdominal pain, abnormal vaginal discharge/itching/odor/irritation, problems with periods, bowel  movements, urination, or intercourse unless otherwise stated above. Pertinent History Reviewed:  Reviewed past medical,surgical, social and family history.  Reviewed problem list, medications and allergies. Physical Assessment:   Vitals:   11/22/23 1438  BP: 121/80  Pulse: 100  Weight: 182 lb (82.6 kg)  Height: 5\' 5"  (1.651 m)  Body mass index is 30.29 kg/m.        Physical Examination:   General appearance - well appearing, and in no distress  Mental status - alert, oriented to person, place, and time  Psych:  She has a normal mood and affect  Skin - warm and dry, normal color, no suspicious lesions noted  Chest - effort normal, all lung fields clear to auscultation bilaterally  Heart - normal rate and regular rhythm  Neck:  midline trachea, no thyromegaly or nodules  Breasts - breasts appear normal, no suspicious masses, no skin or nipple changes or  axillary nodes  Abdomen - soft, nontender, nondistended, no masses or organomegaly  Pelvic - VULVA: normal appearing vulva with no masses, tenderness or lesions  VAGINA: normal appearing vagina with normal color and discharge, no lesions  CERVIX: normal appearing cervix without  discharge or lesions, no CMT  Thin prep pap is done w/ HR HPV cotesting  UTERUS: uterus is felt to be normal size, shape, consistency and nontender   ADNEXA: No adnexal masses or tenderness noted.  Extremities:  No swelling or varicosities noted  Chaperone: Latisha Cresenzo  No results found for this or any previous visit (from the past 24 hours).  Assessment & Plan:  1) Well-Woman Exam  2) No sex drive> no meds, no dep/anx, thinks may be IUD, wants non-hormonal & time for Mirena to be removed anyway (see below for note). If doesn't improve, let me know, would consider Addyi  Labs/procedures today: pap, IUD (see note below)  Mammogram: @ 32yo, or sooner if problems Colonoscopy: @ 32yo, or sooner if problems  No orders of the defined types were placed  in this encounter.   Meds: No orders of the defined types were placed in this encounter.   Follow-up: Return in about 4 weeks (around 12/20/2023) for IUD f/u, CNM, in person.  Cheral Marker CNM, [facility] 11/22/2023 3:17 PM     IUD REMOVAL & RE-INSERTION Patient name: Sandy Peters MRN [id number]  Date of birth: 11-Jul-1991 Subjective Findings:   Sandy Peters is a 33 y.o. G37P1001 African American female being seen today for removal of a Mirena  IUD and insertion of a Paragard  IUD. Her IUD was placed 11/26/15.  No LMP recorded. (Menstrual status: IUD). Last pap today.   The risks and benefits of the method and placement have been thouroughly reviewed with the patient and all questions were answered.  Specifically the patient is aware of failure rate of 08/998, expulsion of the IUD and of possible perforation.  The patient is aware of irregular bleeding due to the method and understands the incidence of irregular bleeding diminishes with time.  Signed copy of informed consent in chart.      11/22/2023    2:35 PM 11/01/2022    8:44 AM 11/17/2020    3:46 PM 06/26/2017    3:13 PM  Depression screen PHQ 2/9  Decreased Interest 0 0 0 0  Down, Depressed, Hopeless 0 0 0 0  PHQ - 2 Score 0 0 0 0  Altered sleeping 0 0 0 0  Tired, decreased energy 1 0 0 0  Change in appetite 0 0 0 0  Feeling bad or failure about yourself  0 0 0 0  Trouble concentrating 0 0 0 0  Moving slowly or fidgety/restless 0 0 0 0  Suicidal thoughts 0 0 0 0  PHQ-9 Score 1 0 0 0        11/22/2023    2:35 PM 11/01/2022    8:44 AM 11/17/2020    3:47 PM  GAD 7 : Generalized Anxiety Score  Nervous, Anxious, on Edge 0 0 0  Control/stop worrying 0 0 0  Worry too much - different things 0 0 0  Trouble relaxing 0 0 0  Restless 0 0 0  Easily annoyed or irritable 0 0 0  Afraid - awful might happen 0 0 0  Total GAD 7 Score 0 0 0     Pertinent History Reviewed:   Reviewed past medical,surgical, social, obstetrical and  family history.  Reviewed problem list, medications and allergies. Objective Findings & Procedure:    Vitals:   11/22/23 1438  BP: 121/80  Pulse: 100  Weight: 182 lb (82.6 kg)  Height: 5\' 5"  (1.651 m)  Body mass index is 30.29 kg/m.  No results  found for this or any previous visit (from the past 24 hours).   Time out was performed.  A graves speculum was placed in the vagina.  The cervix was visualized, prepped using Betadine. The strings were visible. They were grasped and the Mirena IUD was easily removed. The cervix was then grasped with a single-tooth tenaculum. The uterus was found to be retroflexed and it sounded to 8 cm.  Paragard  IUD placed per manufacturer's recommendations without complications. The strings were trimmed to approximately 3 cm.  The patient tolerated the procedure well.   Informal transvaginal sonogram was performed by me and the proper placement of the IUD was verified.   Chaperone: Barrister's clerk & Plan:   1) Mirena IUD removal & Paragard insertion The patient was given post procedure instructions, including signs and symptoms of infection and to check for the strings after each menses or each month, and refraining from intercourse or anything in the vagina for 3 days. She was given a care card with date IUD placed, and date IUD to be removed. She is scheduled for a f/u appointment in 4 weeks.  No orders of the defined types were placed in this encounter.   Follow-up: Return in about 4 weeks (around 12/20/2023) for IUD f/u, CNM, in person.  Cheral Marker CNM, [facility] 11/22/2023 3:17 PM

## 2023-11-22 NOTE — Patient Instructions (Signed)
 Nothing in vagina for 3 days (no sex, douching, tampons, etc...) Check your strings once a month to make sure you can feel them, if you are not able to please let us know If you develop a fever of 100.4 or more in the next few weeks, or if you develop severe abdominal pain, please let [place] know Use a backup method of birth control, such as condoms, for 2 weeks

## 2023-11-22 NOTE — Addendum Note (Signed)
 Addended by: Moss Mc on: 11/22/2023 03:26 PM   Modules accepted: Orders

## 2023-11-28 ENCOUNTER — Encounter: Payer: Self-pay | Admitting: Women's Health

## 2023-11-28 LAB — CYTOLOGY - PAP
Chlamydia: NEGATIVE
Comment: NEGATIVE
Comment: NEGATIVE
Comment: NEGATIVE
Comment: NORMAL
Diagnosis: UNDETERMINED — AB
High risk HPV: NEGATIVE
Neisseria Gonorrhea: NEGATIVE
Trichomonas: NEGATIVE

## 2023-11-28 MED ORDER — METRONIDAZOLE 500 MG PO TABS: 500.0000 mg | ORAL_TABLET | Freq: Two times a day (BID) | ORAL | 0 refills | Status: DC

## 2023-11-28 NOTE — Addendum Note (Signed)
 Addended by: Shawna Clamp R on: 11/28/2023 12:09 PM   Modules accepted: Orders

## 2023-12-20 ENCOUNTER — Encounter: Payer: Self-pay | Admitting: Women's Health

## 2023-12-20 ENCOUNTER — Ambulatory Visit: Admitting: Women's Health

## 2023-12-20 VITALS — BP 124/82 | HR 92 | Ht 66.0 in | Wt 178.0 lb

## 2023-12-20 DIAGNOSIS — Z30431 Encounter for routine checking of intrauterine contraceptive device: Secondary | ICD-10-CM

## 2023-12-20 NOTE — Progress Notes (Signed)
 GYN VISIT Patient name: Sandy Peters MRN [id number]  Date of birth: 1991/03/30 Chief Complaint:   IUD check  History of Present Illness:   Sandy Peters is a 33 y.o. G30P1001 African-American female being seen today for IUD f/u. Paragard  inserted 11/22/23. Period started 4/19, 1st 2 days light, today heavier. No h/o heavy periods. Had no sex drive w/ Mirena , sex is much better now.  Patient's last menstrual period was 12/16/2023 (approximate). The current method of family planning is IUD.  Last pap 11/22/23. Results were: ASCUS w/ HRHPV negative     11/22/2023    2:35 PM 11/01/2022    8:44 AM 11/17/2020    3:46 PM 06/26/2017    3:13 PM  Depression screen PHQ 2/9  Decreased Interest 0 0 0 0  Down, Depressed, Hopeless 0 0 0 0  PHQ - 2 Score 0 0 0 0  Altered sleeping 0 0 0 0  Tired, decreased energy 1 0 0 0  Change in appetite 0 0 0 0  Feeling bad or failure about yourself  0 0 0 0  Trouble concentrating 0 0 0 0  Moving slowly or fidgety/restless 0 0 0 0  Suicidal thoughts 0 0 0 0  PHQ-9 Score 1 0 0 0        11/22/2023    2:35 PM 11/01/2022    8:44 AM 11/17/2020    3:47 PM  GAD 7 : Generalized Anxiety Score  Nervous, Anxious, on Edge 0 0 0  Control/stop worrying 0 0 0  Worry too much - different things 0 0 0  Trouble relaxing 0 0 0  Restless 0 0 0  Easily annoyed or irritable 0 0 0  Afraid - awful might happen 0 0 0  Total GAD 7 Score 0 0 0     Review of Systems:   Pertinent items are noted in HPI Denies fever/chills, dizziness, headaches, visual disturbances, fatigue, shortness of breath, chest pain, abdominal pain, vomiting, abnormal vaginal discharge/itching/odor/irritation, problems with periods, bowel movements, urination, or intercourse unless otherwise stated above.  Pertinent History Reviewed:  Reviewed past medical,surgical, social, obstetrical and family history.  Reviewed problem list, medications and allergies. Physical Assessment:   Vitals:   12/20/23 1509   BP: 124/82  Pulse: 92  Weight: 178 lb (80.7 kg)  Height: 5\' 6"  (1.676 m)  Body mass index is 28.73 kg/m.       Physical Examination:   General appearance: alert, well appearing, and in no distress  Mental status: alert, oriented to person, place, and time  Skin: warm & dry   Cardiovascular: normal heart rate noted  Respiratory: normal respiratory effort, no distress  Abdomen: soft, non-tender   Pelvic: VULVA: normal appearing vulva with no masses, tenderness or lesions, VAGINA: normal appearing vagina with normal color and discharge, no lesions, vault cleared of blood/clots, no active heavy bleeding right now CERVIX: normal appearing cervix without discharge or lesions, IUD strings visible, appropriate length  Extremities: no edema   Chaperone: Steffanie Edouard  No results found for this or any previous visit (from the past 24 hours).  Assessment & Plan:  1) IUD check> in place, discussed periods can be heavier w/ Paragard , if changing a pad/tampon qhr and not letting up, let us  know  Meds: No orders of the defined types were placed in this encounter.   No orders of the defined types were placed in this encounter.   Return for after 3/26 for , Pap & physical.  Jullie Oiler  Rosina Cooler CNM, [facility] 12/20/2023 3:30 PM
